# Patient Record
Sex: Male | Born: 1935 | Race: Black or African American | Hispanic: No | Marital: Married | Smoking: Former smoker
Health system: Southern US, Community
[De-identification: ages and names within clinical notes are randomized; demographics above are authoritative.]

## PROBLEM LIST (undated history)

## (undated) DIAGNOSIS — E119 Type 2 diabetes mellitus without complications: Secondary | ICD-10-CM

## (undated) DIAGNOSIS — N400 Enlarged prostate without lower urinary tract symptoms: Secondary | ICD-10-CM

## (undated) DIAGNOSIS — I10 Essential (primary) hypertension: Secondary | ICD-10-CM

## (undated) DIAGNOSIS — N390 Urinary tract infection, site not specified: Secondary | ICD-10-CM

## (undated) DIAGNOSIS — N3281 Overactive bladder: Secondary | ICD-10-CM

## (undated) HISTORY — DX: Type 2 diabetes mellitus without complications: E11.9

## (undated) HISTORY — DX: Essential (primary) hypertension: I10

## (undated) HISTORY — PX: NO PAST SURGERIES: SHX2092

## (undated) HISTORY — PX: OTHER SURGICAL HISTORY: SHX169

---

## 2012-11-11 DIAGNOSIS — N400 Enlarged prostate without lower urinary tract symptoms: Secondary | ICD-10-CM | POA: Insufficient documentation

## 2014-05-10 DIAGNOSIS — I10 Essential (primary) hypertension: Secondary | ICD-10-CM | POA: Insufficient documentation

## 2014-09-10 DIAGNOSIS — R7303 Prediabetes: Secondary | ICD-10-CM | POA: Insufficient documentation

## 2017-01-07 DIAGNOSIS — Z66 Do not resuscitate: Secondary | ICD-10-CM | POA: Insufficient documentation

## 2018-09-07 ENCOUNTER — Ambulatory Visit (INDEPENDENT_AMBULATORY_CARE_PROVIDER_SITE_OTHER): Payer: Medicare HMO | Admitting: Urology

## 2018-09-07 ENCOUNTER — Encounter: Payer: Self-pay | Admitting: Urology

## 2018-09-07 VITALS — BP 150/90 | HR 89 | Ht 68.0 in | Wt 182.7 lb

## 2018-09-07 DIAGNOSIS — N401 Enlarged prostate with lower urinary tract symptoms: Secondary | ICD-10-CM | POA: Diagnosis not present

## 2018-09-07 DIAGNOSIS — R351 Nocturia: Secondary | ICD-10-CM

## 2018-09-07 DIAGNOSIS — N3281 Overactive bladder: Secondary | ICD-10-CM | POA: Diagnosis not present

## 2018-09-07 DIAGNOSIS — N138 Other obstructive and reflux uropathy: Secondary | ICD-10-CM

## 2018-09-07 DIAGNOSIS — N3941 Urge incontinence: Secondary | ICD-10-CM

## 2018-09-07 LAB — MICROSCOPIC EXAMINATION
BACTERIA UA: NONE SEEN
Epithelial Cells (non renal): NONE SEEN /hpf (ref 0–10)

## 2018-09-07 LAB — URINALYSIS, COMPLETE
Bilirubin, UA: NEGATIVE
GLUCOSE, UA: NEGATIVE
KETONES UA: NEGATIVE
NITRITE UA: NEGATIVE
Protein, UA: NEGATIVE
RBC, UA: NEGATIVE
SPEC GRAV UA: 1.015 (ref 1.005–1.030)
Urobilinogen, Ur: 0.2 mg/dL (ref 0.2–1.0)
pH, UA: 7 (ref 5.0–7.5)

## 2018-09-07 LAB — BLADDER SCAN AMB NON-IMAGING: Scan Result: 219

## 2018-09-07 MED ORDER — FINASTERIDE 5 MG PO TABS: 5.0000 mg | ORAL_TABLET | Freq: Every day | ORAL | 3 refills | Status: DC

## 2018-09-07 NOTE — Progress Notes (Addendum)
09/07/2018 9:23 AM   James Oconnor 17-Jan-1930 [id number]  Referring provider: Dion Body, MD [name] [name], [name] [address]  Chief Complaint  Patient presents with  . Over Active Bladder    HPI: Patient is a 84 year old African American male with HTN and DM who is referred by Dr. Dion Oconnor for OAB.  He is experiencing urinary frequency, urgency, dysuria, nocturia x 2-3, intermittency, hesitancy, straining to urinate, weak urinary stream and urge incontinence.   Patient denies any gross hematuria, dysuria or suprapubic/flank pain.  Patient denies any fevers, chills, nausea or vomiting.  His UA is negative.  His PVR is 219 mL.    He has been on Cialis 5 mg daily for three years.  His urinary symptoms have been worsening since April.    He had been on finasteride since 2011 and then it was discontinued.  He thinks it was because the physician felt the Flomax would address his symptoms better.   In 2012, he was seen by [facility] in [place] for BPH and LU TS.  He was recommended two medication and neither was effective.  He does recall one of the medication was Myrbetriq and it caused constipation.   He admits to constipation and/or diarrhea.  He is drinking a lot of water daily.   He is drinking cranberry juice.  No coffee, sodas, teas or alcohol.      PMH: Past Medical History:  Diagnosis Date  . Diabetes mellitus without complication ([place])   . Hypertension     Surgical History: None  Home Medications:  Allergies as of 09/07/2018   Not on File     Medication List        Accurate as of 09/07/18  9:23 AM. Always use your most recent med list.          lisinopril 10 MG tablet Commonly known as:  PRINIVIL,ZESTRIL Take 10 mg by mouth daily.   tadalafil 5 MG tablet Commonly known as:  CIALIS Take 5 mg by mouth daily as needed for erectile dysfunction (take 2.5 daily).       Allergies: Not on  File  Family History: No family history on file.  Social History:  reports that he has quit smoking. His smoking use included cigarettes. He quit after 60.00 years of use. He has never used smokeless tobacco. His alcohol and drug histories are not on file.  ROS: UROLOGY Frequent Urination?: No Hard to postpone urination?: No Burning/pain with urination?: No Get up at night to urinate?: No Leakage of urine?: No Urine stream starts and stops?: No Trouble starting stream?: No Do you have to strain to urinate?: No Blood in urine?: No Urinary tract infection?: No Sexually transmitted disease?: No Injury to kidneys or bladder?: No Painful intercourse?: No Weak stream?: Yes Erection problems?: No Penile pain?: No  Gastrointestinal Nausea?: No Vomiting?: No Indigestion/heartburn?: No Diarrhea?: No Constipation?: No  Constitutional Fever: No Night sweats?: No Weight loss?: No Fatigue?: No  Skin Skin rash/lesions?: No Itching?: No  Eyes Blurred vision?: No Double vision?: No  Ears/Nose/Throat Sore throat?: No Sinus problems?: No  Hematologic/Lymphatic Swollen glands?: No Easy bruising?: No  Cardiovascular Leg swelling?: No Chest pain?: No  Respiratory Cough?: No Shortness of breath?: No  Endocrine Excessive thirst?: No  Musculoskeletal Back pain?: No Joint pain?: Yes  Neurological Headaches?: No Dizziness?: No  Psychologic Depression?: No Anxiety?: No  Physical Exam: BP (!) 150/90 (BP Location: Left Arm, Patient  Position: Sitting, Cuff Size: Normal)   Pulse 89   Ht 5\' 8"  (1.727 m)   Wt 182 lb 11.2 oz (82.9 kg)   BMI 27.78 kg/m   Constitutional:  Well nourished. Alert and oriented, No acute distress. HEENT: [place] AT, moist mucus membranes.  Trachea midline, no masses. Cardiovascular: No clubbing, cyanosis, or edema. Respiratory: Normal respiratory effort, no increased work of breathing. GI: Abdomen is soft, non tender, non distended, no  abdominal masses. Liver and spleen not palpable.  No hernias appreciated.  Stool sample for occult testing is not indicated.   GU: No CVA tenderness.  No bladder fullness or masses.  Patient with circumcised phallus.  Urethral meatus is patent.  No penile discharge. No penile lesions or rashes. Scrotum without lesions, cysts, rashes and/or edema.  Testicles are located scrotally bilaterally. No masses are appreciated in the testicles. Left and right epididymis are normal. Rectal: Patient with  normal sphincter tone. Anus and perineum without scarring or rashes. No rectal masses are appreciated. Prostate is approximately 70 + grams, could only palpate the apex and midportion of gland, no nodules are appreciated.  Skin: No rashes, bruises or suspicious lesions. Lymph: No cervical or inguinal adenopathy. Neurologic: Grossly intact, no focal deficits, moving all 4 extremities. Psychiatric: Normal mood and affect.  Laboratory Data: PSA Trend  3.30 in November 2016  3.24 and February 2018   No results found for: WBC, HGB, HCT, MCV, PLT  No results found for: CREATININE  No results found for: PSA  No results found for: TESTOSTERONE  No results found for: HGBA1C  No results found for: TSH  No results found for: CHOL, HDL, CHOLHDL, VLDL, LDLCALC  No results found for: AST No results found for: ALT No components found for: ALKALINEPHOPHATASE No components found for: BILIRUBINTOTAL  No results found for: ESTRADIOL  Urinalysis No results found for: COLORURINE, APPEARANCEUR, LABSPEC, PHURINE, GLUCOSEU, HGBUR, BILIRUBINUR, KETONESUR, PROTEINUR, UROBILINOGEN, NITRITE, LEUKOCYTESUR  I have reviewed the labs.   Pertinent Imaging: Results for James Oconnor (MRN [id number]) as of 09/07/2018 09:18  Ref. Range 09/07/2018 08:32  Scan Result Unknown [id number]     Assessment & Plan:    1. BPH with LUTS Continue conservative management, avoiding bladder irritants and timed voiding's Most bothersome  symptoms is/are urge incontinence -explained to the patient that this is due to bladder emptying likely due to obstruction caused by his large prostate.  I discussed with him that the tadalafil was not addressing the size of the prostate and that one option would be is to restart the finasteride at this time.  I did explain to the patient that the effects of the finasteride may not be seen for 6 months and that he may want to consider undergoing an evaluation for a possible bladder outlet procedure as that would give him more immediate results.  He has decided against any surgical procedure and would like to try the finasteride at this time. I also explained that we typically do not screen for prostate cancer in gentleman of his age him about his prostate enlargement may be due to a prostate cancer and if so the finasteride may not be effective in treating his lower urinary tract symptoms so for that reason we will obtain a PSA at this time. I have sent a prescription for the finasteride 5 mg daily to his preferred pharmacy and he will continue the tadalafil 5 mg daily as well He will return in 3 months, midway through therapy, for IPSS score,  PSA and PVR  2.  Urge incontinence See above  3. Nocturia Likely due to incomplete bladder emptying Will reassess at 3 and 6 months    Return in about 3 months (around 12/08/2018) for IPSS, PVR, PSA and exam.  These notes generated with voice recognition software. I apologize for typographical errors.  Zara Council, PA-C  [facility] [address]  [place], [place] [address] [phone]

## 2018-09-08 ENCOUNTER — Other Ambulatory Visit: Payer: Self-pay | Admitting: Urology

## 2018-09-08 DIAGNOSIS — R972 Elevated prostate specific antigen [PSA]: Secondary | ICD-10-CM

## 2018-09-08 LAB — PSA: Prostate Specific Ag, Serum: 7.2 ng/mL — ABNORMAL HIGH (ref 0.0–4.0)

## 2018-09-08 NOTE — Progress Notes (Signed)
MRI orders in the chart

## 2018-10-21 ENCOUNTER — Telehealth: Payer: Self-pay | Admitting: Urology

## 2018-10-21 NOTE — Telephone Encounter (Signed)
Patient was not home but I gave his wife the number and asked her to have him call [facility] to schedule    James Oconnor

## 2018-10-21 NOTE — Telephone Encounter (Signed)
Would you check on the status of Mr. James Oconnor of the prostate?

## 2018-11-08 ENCOUNTER — Ambulatory Visit
Admission: RE | Admit: 2018-11-08 | Discharge: 2018-11-08 | Disposition: A | Discharge status: Home / Self Care | Payer: Medicare HMO | Source: Ambulatory Visit | Attending: Urology | Admitting: Urology

## 2018-11-08 DIAGNOSIS — R972 Elevated prostate specific antigen [PSA]: Secondary | ICD-10-CM

## 2018-11-08 MED ORDER — GADOBENATE DIMEGLUMINE 529 MG/ML IV SOLN
18.0000 mL | Freq: Once | INTRAVENOUS | Status: AC | PRN
  Administered 2018-11-08: 18 mL via INTRAVENOUS

## 2018-12-06 ENCOUNTER — Other Ambulatory Visit: Payer: Self-pay

## 2018-12-06 DIAGNOSIS — R972 Elevated prostate specific antigen [PSA]: Secondary | ICD-10-CM

## 2018-12-07 ENCOUNTER — Other Ambulatory Visit: Payer: Medicare HMO

## 2018-12-07 DIAGNOSIS — R972 Elevated prostate specific antigen [PSA]: Secondary | ICD-10-CM

## 2018-12-07 NOTE — Progress Notes (Signed)
12/09/2018 10:07 AM   James Oconnor 1930/11/03 [id number]  Referring provider: Dion Body, MD [facility] [place], [place] [address]  Chief Complaint  Patient presents with  . Follow-up   HPI: James Oconnor is a 85 y.o. male African American with HTN and DM with a history of elevated PSA and PI-RADS 4 lesion on MRI of prostate who presents today for 3 month follow up.    Background History He first presented on 09/07/2018 reporting that he had been experiencing urinary frequency, urgency, dysuria, nocturia x 2-3, intermittency, hesitancy, straining to urinate, weak urinary stream and urge incontinence.   Patient denied at that time any gross hematuria, dysuria or suprapubic/flank pain.  Patient denied at that time any fevers, chills, nausea or vomiting.  His UA was negative and his PVR was 219 mL.    He has been on Cialis 5 mg daily for three years.  His urinary symptoms have been worsening since April.    He had been on finasteride since 2011 and then it was discontinued for a period; he thought it was because the physician felt the Flomax would address his symptoms better.   In 2012, he was seen by [facility] in [place] for BPH and LU TS.  He was recommended two medications and neither was effective.  He does recall one of the medication was Myrbetriq and it caused constipation.   He admitted to constipation and/or diarrhea.  He was drinking a lot of water daily and cranberry juice.  No coffee, sodas, teas or alcohol.  Started back on finasteride 5 mg daily on 09/07/2018.    BPH WITH LUTS  (prostate and/or bladder) IPSS score: 17/2 PVR: 209 mL   Previous score: No previous score   Previous PVR: 219 mL  Major complaint(s):  Nocturia 2 x. Denies any dysuria, hematuria or suprapubic pain.   Currently taking: finasteride 5 mg daily.  He reports a reduction in urgency.  He has temporarily discontinued the Cialis but is considering resuming  it.  He does not have a family history of PCa.  IPSS    Row Name 12/09/18 0800         International Prostate Symptom Score   How often have you had the sensation of not emptying your bladder?  Less than half the time     How often have you had to urinate less than every two hours?  About half the time     How often have you found you stopped and started again several times when you urinated?  Less than half the time     How often have you found it difficult to postpone urination?  About half the time     How often have you had a weak urinary stream?  Less than half the time     How often have you had to strain to start urination?  Less than half the time     How many times did you typically get up at night to urinate?  3 Times     Total IPSS Score  17       Quality of Life due to urinary symptoms   If you were to spend the rest of your life with your urinary condition just the way it is now how would you feel about that?  Mostly Satisfied       Score:  1-7 Mild 8-19 Moderate 20-35 Severe   Elevated PSA PSA was found to  be 7.2 on 09/07/2018.  MRI prostate on 11/08/2018 was revealed A PI-RADS category 4 lesion of the right posterolateral peripheral zone at the apex is the only lesion of concern identified. Appropriate targeting data sent to [place]. Benign prostatic hypertrophy with marked prostatomegaly, prostate volume 155.77 cubic cm.  Trabeculation of the bladder wall with small bladder diverticula raising the possibility of some degree of chronic outlet obstruction.  Patient deferred pursuing a biopsy at this time.  Repeated PSA 2.7 on 12/07/2018.    PMH: Past Medical History:  Diagnosis Date  . Diabetes mellitus without complication ([place])   . Hypertension     Surgical History: None  Home Medications:  Allergies as of 12/09/2018   No Known Allergies     Medication List       Accurate as of December 09, 2018 10:07 AM. Always use your most recent med list.         finasteride 5 MG tablet Commonly known as:  PROSCAR Take 1 tablet (5 mg total) by mouth daily.   lisinopril 10 MG tablet Commonly known as:  PRINIVIL,ZESTRIL Take 10 mg by mouth daily.   tadalafil 5 MG tablet Commonly known as:  CIALIS Take 5 mg by mouth daily as needed for erectile dysfunction (take 2.5 daily).       Allergies: No Known Allergies  Family History: History reviewed. No pertinent family history.  Social History:  reports that he has quit smoking. His smoking use included cigarettes. He quit after 60.00 years of use. He has never used smokeless tobacco. No history on file for alcohol and drug.  ROS: UROLOGY Frequent Urination?: Yes Hard to postpone urination?: Yes Burning/pain with urination?: No Get up at night to urinate?: Yes Leakage of urine?: Yes Urine stream starts and stops?: No Trouble starting stream?: No Do you have to strain to urinate?: No Blood in urine?: No Urinary tract infection?: No Sexually transmitted disease?: No Injury to kidneys or bladder?: No Painful intercourse?: No Weak stream?: No Erection problems?: Yes Penile pain?: No  Gastrointestinal Nausea?: No Vomiting?: No Indigestion/heartburn?: No Diarrhea?: No Constipation?: No  Constitutional Fever: No Night sweats?: No Weight loss?: No Fatigue?: No  Skin Skin rash/lesions?: No Itching?: No  Eyes Blurred vision?: Yes Double vision?: No  Ears/Nose/Throat Sore throat?: No Sinus problems?: No  Hematologic/Lymphatic Swollen glands?: No Easy bruising?: No  Cardiovascular Leg swelling?: No Chest pain?: No  Respiratory Cough?: No Shortness of breath?: No  Endocrine Excessive thirst?: No  Musculoskeletal Back pain?: No Joint pain?: No  Neurological Headaches?: No Dizziness?: No  Psychologic Depression?: No Anxiety?: No  Physical Exam: BP (!) 144/90   Pulse 80   Ht 5\' 8"  (1.727 m)   Wt 190 lb (86.2 kg)   BMI 28.89 kg/m   Constitutional:   Well nourished. Alert and oriented, No acute distress. Cardiovascular: No clubbing, cyanosis, or edema. Respiratory: Normal respiratory effort, no increased work of breathing. Skin: No rashes, bruises or suspicious lesions. Neurologic: Grossly intact, no focal deficits, moving all 4 extremities. Psychiatric: Normal mood and affect.  Laboratory Data:  PSA Trend  3.30 in November 2016  3.24 in February 2018  7.2 in October 2019  2.7 in January 2020  I have reviewed the labs.  Pertinent Imaging:  CLINICAL DATA:  Elevated PSA levels, PSA level 7.2 on 09/07/2018. Urinary frequency with urgency and dysuria.  EXAM: MR PROSTATE WITHOUT AND WITH CONTRAST  TECHNIQUE: Multiplanar multisequence MRI images were obtained of the pelvis centered about the prostate.  Pre and post contrast images were obtained.  CONTRAST:  48mL MULTIHANCE GADOBENATE DIMEGLUMINE 529 MG/ML IV SOLN  Creatinine was obtained on site at [place] at [address]  Results: Creatinine 1.3 mg/dL.  COMPARISON:  None.  FINDINGS: Prostate: A PI-RADS category 4 lesion of the right posterolateral peripheral zone at the apex is designated region of interest # 1 and measures 0.60 cubic cm (1.6 by 0.6 by 0.9 cm). This lesion has low T2 and ADC map characteristics and does appear to have early enhancement on dynamic images.  Notable prominence of the central gland in a multinodular pattern characteristic of benign prostatic hypertrophy.  Volume: 3D volumetric assessment: Prostate volume 155.77 cubic cm (6.7 by 6.0 by 8.3 cm).  Transcapsular spread:  Absent  Seminal vesicle involvement: Absent  Neurovascular bundle involvement: Absent  Pelvic adenopathy: Absent  Bone metastasis: Absent  Other findings: Trabeculation of the bladder margin with small bladder diverticula suggesting some degree of chronic outlet obstruction. 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  IMPRESSION: 1. A PI-RADS category 4 lesion of the right posterolateral peripheral zone at the apex is the only lesion of concern identified. Appropriate targeting data sent to [place]. 2. Benign prostatic hypertrophy with marked prostatomegaly, prostate volume 155.77 cubic cm. 3. Trabeculation of the bladder wall with small bladder diverticula raising the possibility of some degree of chronic outlet obstruction.   Electronically Signed   By: Van Clines M.D.   On: 11/09/2018 09:05  Results for HALIM, SURRETTE (MRN [id number]) as of 09/07/2018 09:18  Ref. Range 09/07/2018 08:32 12/09/2018 09:00  Scan Result Unknown [id number]    Assessment & Plan:    1. History of elevated PSA - PSA 7.2 in 08/2018, repeated PSA 2.7 in 11/2018 - MRI of prostate notes PI-RADS 4 lesion, but patient has deferred biopsy due to age and co-morbidities  - RTC in 6 months for PSA  2. BPH with LUTS IPSS score is 17/2  Continue conservative management, avoiding bladder irritants and timed voiding's Continue finasteride 5 mg daily; refills given May restart Cialis  RTC in 6 months for IPSS, PSA, PVR and exam   3.  Urge incontinence Improved  4. Nocturia - Likely due to incomplete bladder emptying   Return in about 6 months (around 06/09/2019) for IPSS, PVR, PSA and exam.  I, Adele Schilder, am acting as a Education administrator for [employer], PA-C.   I have reviewed the above documentation for accuracy and completeness, and I agree with the above.    Zara Council, PA-C  [facility] [address]  [place], [place] [address] [phone]

## 2018-12-08 LAB — PSA: Prostate Specific Ag, Serum: 2.7 ng/mL (ref 0.0–4.0)

## 2018-12-09 ENCOUNTER — Ambulatory Visit (INDEPENDENT_AMBULATORY_CARE_PROVIDER_SITE_OTHER): Payer: Medicare HMO | Admitting: Urology

## 2018-12-09 ENCOUNTER — Encounter: Payer: Self-pay | Admitting: Urology

## 2018-12-09 VITALS — BP 144/90 | HR 80 | Ht 68.0 in | Wt 190.0 lb

## 2018-12-09 DIAGNOSIS — N401 Enlarged prostate with lower urinary tract symptoms: Secondary | ICD-10-CM

## 2018-12-09 DIAGNOSIS — Z87898 Personal history of other specified conditions: Secondary | ICD-10-CM | POA: Diagnosis not present

## 2018-12-09 DIAGNOSIS — N3941 Urge incontinence: Secondary | ICD-10-CM

## 2018-12-09 DIAGNOSIS — R972 Elevated prostate specific antigen [PSA]: Secondary | ICD-10-CM

## 2018-12-09 DIAGNOSIS — R351 Nocturia: Secondary | ICD-10-CM | POA: Diagnosis not present

## 2018-12-09 DIAGNOSIS — N138 Other obstructive and reflux uropathy: Secondary | ICD-10-CM

## 2018-12-09 LAB — BLADDER SCAN AMB NON-IMAGING: Scan Result: 209

## 2018-12-09 MED ORDER — FINASTERIDE 5 MG PO TABS: 5.0000 mg | ORAL_TABLET | Freq: Every day | ORAL | 3 refills | Status: DC

## 2019-01-11 DIAGNOSIS — Z862 Personal history of diseases of the blood and blood-forming organs and certain disorders involving the immune mechanism: Secondary | ICD-10-CM | POA: Insufficient documentation

## 2019-05-03 ENCOUNTER — Other Ambulatory Visit: Payer: Self-pay | Admitting: Physician Assistant

## 2019-05-03 ENCOUNTER — Ambulatory Visit
Admission: RE | Admit: 2019-05-03 | Discharge: 2019-05-03 | Disposition: A | Discharge status: Home / Self Care | Payer: Medicare HMO | Source: Ambulatory Visit | Attending: Physician Assistant | Admitting: Physician Assistant

## 2019-05-03 ENCOUNTER — Other Ambulatory Visit: Payer: Self-pay

## 2019-05-03 DIAGNOSIS — M79604 Pain in right leg: Secondary | ICD-10-CM | POA: Diagnosis present

## 2019-05-31 DIAGNOSIS — N528 Other male erectile dysfunction: Secondary | ICD-10-CM | POA: Insufficient documentation

## 2019-06-06 ENCOUNTER — Other Ambulatory Visit: Payer: Self-pay

## 2019-06-06 DIAGNOSIS — R972 Elevated prostate specific antigen [PSA]: Secondary | ICD-10-CM

## 2019-06-07 ENCOUNTER — Other Ambulatory Visit: Payer: Medicare HMO

## 2019-06-07 ENCOUNTER — Other Ambulatory Visit: Payer: Self-pay

## 2019-06-07 DIAGNOSIS — R972 Elevated prostate specific antigen [PSA]: Secondary | ICD-10-CM

## 2019-06-08 LAB — PSA: Prostate Specific Ag, Serum: 2.8 ng/mL (ref 0.0–4.0)

## 2019-06-08 NOTE — Progress Notes (Signed)
12/09/2018 9:11 AM   James Oconnor 1930-04-30 [id number]  Referring provider: Dion Body, MD [facility] [place],  [place] [address]  Chief Complaint  Patient presents with  . Elevated PSA  . Benign Prostatic Hypertrophy   HPI: James Oconnor is a 85 y.o. male who is DNR with HTN and DM with a history of elevated PSA and PI-RADS 4 lesion on MRI of prostate who presents today for 3 month follow up.    Background History He first presented on 09/07/2018 reporting that he had been experiencing urinary frequency, urgency, dysuria, nocturia x 2-3, intermittency, hesitancy, straining to urinate, weak urinary stream and urge incontinence.   Patient denied at that time any gross hematuria, dysuria or suprapubic/flank pain.  Patient denied at that time any fevers, chills, nausea or vomiting.  His UA was negative and his PVR was 219 mL.    He has been on Cialis 5 mg daily for three years.  His urinary symptoms have been worsening since April.    He had been on finasteride since 2011 and then it was discontinued for a period; he thought it was because the physician felt the Flomax would address his symptoms better.   In 2012, he was seen by [facility] in [place] for BPH and LU TS.  He was recommended two medications and neither was effective.  He does recall one of the medication was Myrbetriq and it caused constipation.   He admitted to constipation and/or diarrhea.  He was drinking a lot of water daily and cranberry juice.  No coffee, sodas, teas or alcohol.  Started back on finasteride 5 mg daily on 09/07/2018.     BPH WITH LUTS  (prostate and/or bladder) IPSS score: 32/6    PVR: 202 mL   Previous score: 17/2  Previous PVR: 209 mL  Major complaint(s):  Frequency, urgency, nocturia, incontinence, hesitancy, straining and weak stream  x. Denies any dysuria, hematuria or suprapubic pain.   Currently taking: finasteride 5 mg daily.  He stopped the  finasteride in June due to side effects that he read about on the Internet.    He reports a reduction in urgency.  He will restart the Cialis 5 mg daily.    He does not have a family history of PCa.  IPSS    Row Name 06/09/19 0800         International Prostate Symptom Score   How often have you had the sensation of not emptying your bladder?  Almost always     How often have you had to urinate less than every two hours?  More than half the time     How often have you found you stopped and started again several times when you urinated?  More than half the time     How often have you found it difficult to postpone urination?  Almost always     How often have you had a weak urinary stream?  Almost always     How often have you had to strain to start urination?  More than half the time     How many times did you typically get up at night to urinate?  5 Times     Total IPSS Score  32       Quality of Life due to urinary symptoms   If you were to spend the rest of your life with your urinary condition just the way it is now how would  you feel about that?  Terrible       Score:  1-7 Mild 8-19 Moderate 20-35 Severe   Elevated PSA PSA was found to be 7.2 on 09/07/2018.  MRI prostate on 11/08/2018 was revealed A PI-RADS category 4 lesion of the right posterolateral peripheral zone at the apex is the only lesion of concern identified. Appropriate targeting data sent to [place].  Benign prostatic hypertrophy with marked prostatomegaly, prostate volume 155.77 cubic cm.  Trabeculation of the bladder wall with small bladder diverticula raising the possibility of some degree of chronic outlet obstruction.  Patient deferred pursuing a biopsy at this time.  Repeated PSA 2.8 on 05/2019.    PMH: Past Medical History:  Diagnosis Date  . Diabetes mellitus without complication ([place])   . Hypertension     Surgical History: None  Home Medications:  Allergies as of 06/09/2019   No Known Allergies      Medication List       Accurate as of June 09, 2019  9:11 AM. If you have any questions, ask your nurse or doctor.        STOP taking these medications   meloxicam 7.5 MG tablet Commonly known as: MOBIC Stopped by: Zara Council, PA-C     TAKE these medications   finasteride 5 MG tablet Commonly known as: Proscar Take 1 tablet (5 mg total) by mouth daily.   lisinopril 10 MG tablet Commonly known as: ZESTRIL Take 10 mg by mouth daily.   tadalafil 5 MG tablet Commonly known as: CIALIS Take 5 mg by mouth daily as needed for erectile dysfunction (take 2.5 daily).       Allergies: No Known Allergies  Family History: History reviewed. No pertinent family history.  Social History:  reports that he has quit smoking. His smoking use included cigarettes. He quit after 60.00 years of use. He has never used smokeless tobacco. No history on file for alcohol and drug.  ROS: UROLOGY Frequent Urination?: Yes Hard to postpone urination?: Yes Burning/pain with urination?: No Get up at night to urinate?: Yes Leakage of urine?: Yes Urine stream starts and stops?: Yes Trouble starting stream?: Yes Do you have to strain to urinate?: Yes Blood in urine?: No Urinary tract infection?: No Sexually transmitted disease?: No Injury to kidneys or bladder?: No Painful intercourse?: No Weak stream?: Yes Erection problems?: Yes Penile pain?: No  Gastrointestinal Nausea?: No Vomiting?: No Indigestion/heartburn?: No Diarrhea?: No Constipation?: No  Constitutional Fever: No Night sweats?: No Weight loss?: No Fatigue?: No  Skin Skin rash/lesions?: No Itching?: No  Eyes Blurred vision?: Yes Double vision?: No  Ears/Nose/Throat Sore throat?: No Sinus problems?: No  Hematologic/Lymphatic Swollen glands?: No Easy bruising?: No  Cardiovascular Leg swelling?: No Chest pain?: No  Respiratory Cough?: Yes Shortness of breath?: No  Endocrine Excessive thirst?: No   Musculoskeletal Back pain?: No Joint pain?: Yes  Neurological Headaches?: No Dizziness?: No  Psychologic Depression?: No Anxiety?: No  Physical Exam: There were no vitals taken for this visit.  Constitutional:  Well nourished. Alert and oriented, No acute distress. HEENT: [place] AT, moist mucus membranes.  Trachea midline, no masses. Cardiovascular: No clubbing, cyanosis, or edema. Respiratory: Normal respiratory effort, no increased work of breathing. GI: Abdomen is soft, non tender, non distended, no abdominal masses. Liver and spleen not palpable.  No hernias appreciated.  Stool sample for occult testing is not indicated.   GU: No CVA tenderness.  No bladder fullness or masses.  Patient with circumcised phallus.  Urethral  meatus is patent.  No penile discharge. No penile lesions or rashes. Scrotum without lesions, cysts, rashes and/or edema.  Testicles are located scrotally bilaterally. No masses are appreciated in the testicles. Left and right epididymis are normal. Rectal: Patient with  normal sphincter tone. Anus and perineum without scarring or rashes. No rectal masses are appreciated. Prostate is approximately 60 grams, could only palpate the apex and midportion of the gland, no nodules are appreciated.  Skin: No rashes, bruises or suspicious lesions. Lymph: No inguinal adenopathy. Neurologic: Grossly intact, no focal deficits, moving all 4 extremities. Psychiatric: Normal mood and affect.  Laboratory Data:  PSA Trend  3.30 in November 2016  3.24 in February 2018  7.2 in October 2019  2.7 in January 2020  2.8 in July 2020 I have reviewed the labs.  Pertinent Imaging:  CLINICAL DATA:  Elevated PSA levels, PSA level 7.2 on 09/07/2018. Urinary frequency with urgency and dysuria.  EXAM: MR PROSTATE WITHOUT AND WITH CONTRAST  TECHNIQUE: Multiplanar multisequence MRI images were obtained of the pelvis centered about the prostate. Pre and post contrast images were  obtained.  CONTRAST:  64mL MULTIHANCE GADOBENATE DIMEGLUMINE 529 MG/ML IV SOLN  Creatinine was obtained on site at [facility] at [address]  Results: Creatinine 1.3 mg/dL.  COMPARISON:  None.  FINDINGS: Prostate: A PI-RADS category 4 lesion of the right posterolateral peripheral zone at the apex is designated region of interest # 1 and measures 0.60 cubic cm (1.6 by 0.6 by 0.9 cm). This lesion has low T2 and ADC map characteristics and does appear to have early enhancement on dynamic images.  Notable prominence of the central gland in a multinodular pattern characteristic of benign prostatic hypertrophy.  Volume: 3D volumetric assessment: Prostate volume 155.77 cubic cm (6.7 by 6.0 by 8.3 cm).  Transcapsular spread:  Absent  Seminal vesicle involvement: Absent  Neurovascular bundle involvement: Absent  Pelvic adenopathy: Absent  Bone metastasis: Absent  Other findings: Trabeculation of the bladder margin with small bladder diverticula suggesting some degree of chronic outlet obstruction. 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  IMPRESSION: 1. A PI-RADS category 4 lesion of the right posterolateral peripheral zone at the apex is the only lesion of concern identified. Appropriate targeting data sent to [place]. 2. Benign prostatic hypertrophy with marked prostatomegaly, prostate volume 155.77 cubic cm. 3. Trabeculation of the bladder wall with small bladder diverticula raising the possibility of some degree of chronic outlet obstruction.   Electronically Signed   By: Van Clines M.D.   On: 11/09/2018 09:05  Assessment & Plan:    1. History of elevated PSA PSA 7.2 in 08/2018, repeated PSA 2.8 in 05/2019 - has stopped finasteride  MRI of prostate notes PI-RADS 4 lesion, but patient has deferred biopsy due to age and co-morbidities  RTC in 4 months for PSA  2. BPH with LUTS IPSS score is 32/6 Continue conservative  management, avoiding bladder irritants and timed voiding's Finasteride discontinued by patient  Will restart Cialis  RTC in 6 months for IPSS, PSA, PVR and exam   3.  Urge incontinence Will start Cialis and see if there is improvement and refer to PT as he could not tolerate OAB medications   4. Nocturia Likely due to incomplete bladder emptying   Return for PSA only .  Zara Council, PA-C  [facility] [address]  [place], [place] [address] [phone]

## 2019-06-09 ENCOUNTER — Encounter: Payer: Self-pay | Admitting: Urology

## 2019-06-09 ENCOUNTER — Other Ambulatory Visit: Payer: Self-pay

## 2019-06-09 ENCOUNTER — Ambulatory Visit (INDEPENDENT_AMBULATORY_CARE_PROVIDER_SITE_OTHER): Payer: Medicare HMO | Admitting: Urology

## 2019-06-09 VITALS — BP 129/78 | HR 91 | Ht 68.0 in | Wt 183.6 lb

## 2019-06-09 DIAGNOSIS — N3941 Urge incontinence: Secondary | ICD-10-CM | POA: Diagnosis not present

## 2019-06-09 DIAGNOSIS — N138 Other obstructive and reflux uropathy: Secondary | ICD-10-CM | POA: Diagnosis not present

## 2019-06-09 DIAGNOSIS — Z87898 Personal history of other specified conditions: Secondary | ICD-10-CM

## 2019-06-09 DIAGNOSIS — N401 Enlarged prostate with lower urinary tract symptoms: Secondary | ICD-10-CM

## 2019-06-09 DIAGNOSIS — R351 Nocturia: Secondary | ICD-10-CM | POA: Diagnosis not present

## 2019-06-09 LAB — BLADDER SCAN AMB NON-IMAGING: Scan Result: 202

## 2019-06-15 ENCOUNTER — Other Ambulatory Visit: Payer: Self-pay

## 2019-06-15 ENCOUNTER — Encounter: Payer: Self-pay | Admitting: Physical Therapy

## 2019-06-15 ENCOUNTER — Ambulatory Visit: Payer: Medicare HMO | Attending: Urology | Admitting: Physical Therapy

## 2019-06-15 DIAGNOSIS — M533 Sacrococcygeal disorders, not elsewhere classified: Secondary | ICD-10-CM | POA: Diagnosis present

## 2019-06-15 DIAGNOSIS — R278 Other lack of coordination: Secondary | ICD-10-CM

## 2019-06-15 DIAGNOSIS — M6281 Muscle weakness (generalized): Secondary | ICD-10-CM | POA: Diagnosis present

## 2019-06-15 NOTE — Patient Instructions (Signed)
   Standing:  10 reps on both sides x 3 x day     3 point tap   Feet are hip width Tap forward, center under hip not feet next to each other  Tap middle\, center  Tap back     ___   Stretches: 2  x day     Frog stretch: laying on belly with pillow under hips, knees bent, inhale do nothing, exhale let ankles fall apart 30 reps      childs poses rocking  Toes tucked, shoulders down and back, on forearms , hands shoulder width apart 10 reps

## 2019-06-16 NOTE — Therapy (Signed)
[place] MAIN [facility] SERVICES [address] [place], [place], [address] Phone: [phone]   Fax:  [phone]  Physical Therapy Evaluation  Patient Details  Name: James Oconnor MRN: [id number] Date of Birth: 07/03/1930 Referring Provider (PT): Zara Council    Encounter Date: 06/15/2019  PT End of Session - 06/15/19 1407    Visit Number  1    Number of Visits  10    Date for PT Re-Evaluation  08/24/19    PT Start Time  3825    PT Stop Time  1405    PT Time Calculation (min)  63 min       Past Medical History:  Diagnosis Date  . Diabetes mellitus without complication ([place])   . Hypertension     Past Surgical History:  Procedure Laterality Date  . none      There were no vitals filed for this visit.   Subjective Assessment - 06/15/19 1309    Subjective  Pt reports urge incontinence  started when he was being treated for BPH 30 years ago. Frequency has worsened, once every 2 hours. 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 On 1-2 occasions, pt has had leakage before making it to the bathroom. Pt is not emptying his bladder completely. Pt has a weaker stream. Urge occurs when playing golf. Leakage occurs with liftininclude gardening, golf, and eaattending an exercise program [facility] pre COVID: treadmill, walking, chest press, sit ups. Pt performed sit ups for 4 years, Since closure of gyms, pt has been stretching. Bowel movements occurs every day with occasional straining. Denied LBP.  Daily fluid intake: 64 fl oz of water, no tea, 16 fl oz juice, no coffee, no alcohol.  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  Pt would identify where identify where toielts are for "just in case" scenerios.    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 Recent orthopedic Hx:  Pt pulled his R hamstring in Jan 2020.  He also  had a fall in 2013 after falling off a ladder and landing R buttock. Xray showed sciatic nerve problem. Pt is undergoing Physical Therapy and will be d/c next Wednesday because he is 100% better. Denied surgeries , injuries except for the fall in Jan 2013    Patient Stated Goals  reduce the urgency to urinate         [facility] PT Assessment - 06/16/19 0948      Assessment   Medical Diagnosis  urge incontinence     Referring Provider (PT)  Zara Council       Precautions   Precautions  None      Restrictions   Weight Bearing Restrictions  No      Balance Screen   Has the patient fallen in the past 6 months  No      Coordination   Gross Motor Movements are Fluid and Coordinated  --   limited diaphragmatic excursion   Fine Motor Movements are Fluid and Coordinated  --   ab overuse w/ pelvic floor contraction cue     Posture/Postural Control   Posture Comments  perturbations with MMT hip flexion seated      PROM   Overall PROM Comments  R hip ext 0 deg, post Tx: ~10 deg       Strength  Overall Strength Comments  hip flex 4-/5 B, knee ext 3+/5 R, L 4-/5, knee flex B 5/5, B hip ext 3+/5        Palpation   Palpation comment  coccyx flexed, R SIJ hypomobile                 Objective measurements completed on examination: See above findings.    Pelvic Floor Special Questions - 06/16/19 0947    Diastasis Recti  neg     External Perineal Exam  no pelvic tightness       [facility] Adult PT Treatment/Exercise - 06/16/19 0948      Exercises   Exercises  --   cued for exercises, explained anatomy/ physiology     Manual Therapy   Manual therapy comments  superior glide at B sacrococcygeal ligament/ coccgeus, PA mob at base of sacrum, rotation mob to facilitate nutation, ext of coccyx                  PT Long Term Goals - 06/16/19 2094      PT LONG TERM GOAL #1   Title  "Pt will decrease  scores on NIH-CPSI from   44% to <22    % in order to restore pelvic  floor functions    Time  10    Period  Weeks    Status  New    Target Date  08/24/19      PT LONG TERM GOAL #2   Title  Pt will demo less flexed coccyx across 2 weeks in order to restore nutation of sacrum and longer stride length in gait    Time  4    Period  Weeks    Status  New    Target Date  07/14/19      PT LONG TERM GOAL #3   Title  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 in order to participate in functional tasks    Time  8    Period  Weeks    Status  New    Target Date  08/11/19      PT LONG TERM GOAL #4   Title  Pt will demo proper deep core coordination with proper diaphragmatic excursion in order to optimize postural stability and pelvic floor function    Time  2    Period  Weeks    Status  New    Target Date  06/30/19      PT LONG TERM GOAL #5   Title  Pt will demo increased strength at hip ext B from 3+/5 to >4+/5     in order to progress to fitness activities with less risk for injuries    Time  8    Period  Weeks    Status  New    Target Date  08/11/19             Plan - 06/16/19 0949    Clinical Impression Statement  Pt is a 85 yo male who reports urinary urgency, weaker urine stream, and incomplete emptying.These deficits impact his QOL. Pt presents with flexed coccyx and limited sacral nutation, dyscoordination and strength of pelvic floor mm, limited hip mobility,weak hip weakness, poor body mechanics which places strain on the abdominal/pelvic floor mm.  Pt performs many gravity-loaded tasks at work and will require PT in order to learn ways to gradually build their deep core mm to minimize Sx and risk for spinal injuries. Pt performs  many gravity-loaded tasks in fitness and gardening. Pt will benefit from proper coordination training and education on fitness and functional positions in order to yield greater outcomes as pt performed sit-ups/ crunches in the past. Advised pt to not perform sit-ups and crunches as these movement  patterns lead to more downward forces on her pelvic floor, negatively impacting  dysfunctions.  Contributing factors include Hx of fall onto tailbone after falling off a ladder in 2013, BPH, and Hx of sciatica/ R hamstring injury which improved with recent physical therapy HEP of stretches.  Regional interdependent approaches will yield greater benefits in pt's POC.  Pt was provided education on etiology of Sx with anatomy, physiology explanation with images along with the benefits of customized pelvic PT Tx based on pt's medical conditions and musculoskeletal deficits.  Explained the physiology of deep core mm coordination and roles of pelvic floor function in urination, defecation, sexual function, and postural control with deep core mm system.   Following Tx today which pt tolerated without complaints, pt demo'd less flexed coccyx and increased SIJ mobility.      Personal Factors and Comorbidities  Age;Fitness    Examination-Activity Limitations  Loss adjuster, chartered;Shop    Stability/Clinical Decision Making  Stable/Uncomplicated    Clinical Decision Making  Low    Rehab Potential  Good    PT Frequency  1x / week    PT Duration  --   10   PT Treatment/Interventions  Neuromuscular re-education;Moist Heat;Therapeutic activities;Functional mobility training;Therapeutic exercise;Patient/family education;Manual techniques;Manual lymph drainage;Taping;Balance training;Gait training    Consulted and Agree with Plan of Care  Patient       Patient will benefit from skilled therapeutic intervention in order to improve the following deficits and impairments:  Decreased coordination, Decreased range of motion, Decreased endurance, Decreased balance, Decreased mobility, Decreased strength, Postural dysfunction, Improper body mechanics, Decreased safety awareness, Increased muscle spasms, Hypomobility  Visit Diagnosis: 1. Muscle weakness (generalized)   2.  Sacrococcygeal disorders, not elsewhere classified   3. Other lack of coordination        Problem List Patient Active Problem List   Diagnosis Date Noted  . Other male erectile dysfunction 05/31/2019  . History of normocytic normochromic anemia 01/11/2019  . DNR (do not resuscitate) 01/07/2017  . Borderline diabetes mellitus 09/10/2014  . Essential hypertension 05/10/2014  . BPH (benign prostatic hyperplasia) 11/11/2012    Jerl Mina ,PT, DPT, E-RYT  06/16/2019, 9:53 AM  [place] MAIN [facility] SERVICES [address] [place], [place], [address] Phone: [phone]   Fax:  [phone]  Name: James Oconnor MRN: [id number] Date of Birth: [birth year]

## 2019-06-21 ENCOUNTER — Other Ambulatory Visit: Payer: Self-pay

## 2019-06-21 ENCOUNTER — Ambulatory Visit: Payer: Medicare HMO | Admitting: Physical Therapy

## 2019-06-21 DIAGNOSIS — M6281 Muscle weakness (generalized): Secondary | ICD-10-CM

## 2019-06-21 DIAGNOSIS — M533 Sacrococcygeal disorders, not elsewhere classified: Secondary | ICD-10-CM

## 2019-06-21 DIAGNOSIS — R278 Other lack of coordination: Secondary | ICD-10-CM

## 2019-06-21 NOTE — Therapy (Addendum)
[place] MAIN [facility] SERVICES [address] [place], [place], [address] Phone: [phone]   Fax:  [phone]  Physical Therapy Treatment  Patient Details  Name: James Oconnor MRN: [id number] Date of Birth: 28-May-1930 Referring Provider (PT): Zara Council    Encounter Date: 06/21/2019  PT End of Session - 06/21/19 1255    Visit Number  2    Number of Visits  10    Date for PT Re-Evaluation  08/24/19    PT Start Time  1200    PT Stop Time  8938    PT Time Calculation (min)  58 min       Past Medical History:  Diagnosis Date  . Diabetes mellitus without complication ([place])   . Hypertension     Past Surgical History:  Procedure Laterality Date  . none      There were no vitals filed for this visit.  Subjective Assessment - 06/21/19 1200    Subjective  Pt reported he was constipated last week. The frog exercises has helped. When he urinates, he has difficulty until he has a bowel movement. 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 Recent orthopedic Hx:  Pt pulled his R hamstring in Jan 2020.  He also had a fall in 2013 after falling off a ladder and landing R buttock. Xray showed sciatic nerve problem. Pt is undergoing Physical Therapy and will be d/c next Wednesday because he is 100% better. Denied surgeries , injuries except for the fall in Jan 2013    Patient Stated Goals  reduce the urgency to urinate         [facility] PT Assessment - 06/21/19 1249      Coordination   Gross Motor Movements are Fluid and Coordinated  --   difficulty with pelvic tilt propioception   Fine Motor Movements are Fluid and Coordinated  --   overactive adductors with pelvic floor cues     Flexibility   Soft Tissue Assessment /Muscle Length  --   adductor tightness                Pelvic Floor Special Questions - 06/21/19 1249    External Perineal Exam  pelvic floor  tightness at bulbospongiousus, ischiocavernosus B ( decreased post Tx)        [facility] Adult PT Treatment/Exercise - 06/21/19 1252      Neuro Re-ed    Neuro Re-ed Details   cued for form and technique with new exercises. pelvic floor coordination      Modalities   Modalities  --   5     Moist Heat Therapy   Number Minutes Moist Heat  5 Minutes    Moist Heat Location  --   perienum, clothing, pillow case ( during guided relaxation)     Manual Therapy   Manual therapy comments  STM/MWM at anterior pelvic floor mm, abdominal fascial pullingin quadriped/ trunk rotation/shoulder flexion to promote closure of DRA  , STM/ MWM PA mob at T-spine to promote rotatin/ diaphragmatic excursion                  PT Long Term Goals - 06/16/19 1017      PT LONG TERM GOAL #1   Title  "Pt will decrease  scores on NIH-CPSI from   44% to <22    % in order to restore pelvic floor functions  Time  10    Period  Weeks    Status  New    Target Date  08/24/19      PT LONG TERM GOAL #2   Title  Pt will demo less flexed coccyx across 2 weeks in order to restore nutation of sacrum and longer stride length in gait    Time  4    Period  Weeks    Status  New    Target Date  07/14/19      PT LONG TERM GOAL #3   Title  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 in order to participate in functional tasks    Time  8    Period  Weeks    Status  New    Target Date  08/11/19      PT LONG TERM GOAL #4   Title  Pt will demo proper deep core coordination with proper diaphragmatic excursion in order to optimize postural stability and pelvic floor function    Time  2    Period  Weeks    Status  New    Target Date  06/30/19      PT LONG TERM GOAL #5   Title  Pt will demo increased strength at hip ext B from 3+/5 to >4+/5     in order to progress to fitness activities with less risk for injuries    Time  8    Period  Weeks    Status  New    Target Date  08/11/19             Plan - 06/21/19 1256    Clinical Impression Statement  Pt no longer showed a  flexed coccyx with last sesion's manual Tx and exercises. Today focused on increasing diaphragm/ pelvic floor excursion,  decreasing diastasis recti,  and progressed to deep core coordination .  Pt demo'd proper coordination post Tx. Pt continues to benefit form skilled PT and anitcipate with today's improvements, pt will be able to lengthen his pelvic floor for initiating urination and decrease urinary frequency.     Personal Factors and Comorbidities  Age;Fitness    Examination-Activity Limitations  Loss adjuster, chartered;Shop    Stability/Clinical Decision Making  Stable/Uncomplicated    Rehab Potential  Good    PT Frequency  1x / week    PT Duration  --   10   PT Treatment/Interventions  Neuromuscular re-education;Moist Heat;Therapeutic activities;Functional mobility training;Therapeutic exercise;Patient/family education;Manual techniques;Manual lymph drainage;Taping;Balance training;Gait training    Consulted and Agree with Plan of Care  Patient       Patient will benefit from skilled therapeutic intervention in order to improve the following deficits and impairments:  Decreased coordination, Decreased range of motion, Decreased endurance, Decreased balance, Decreased mobility, Decreased strength, Postural dysfunction, Improper body mechanics, Decreased safety awareness, Increased muscle spasms, Hypomobility  Visit Diagnosis: 1. Sacrococcygeal disorders, not elsewhere classified   2. Muscle weakness (generalized)   3. Other lack of coordination        Problem List Patient Active Problem List   Diagnosis Date Noted  . Other male erectile dysfunction 05/31/2019  . History of normocytic normochromic anemia 01/11/2019  . DNR (do not resuscitate) 01/07/2017  . Borderline diabetes mellitus 09/10/2014  . Essential hypertension 05/10/2014  . BPH (benign  prostatic hyperplasia) 11/11/2012    Jerl Mina 06/21/2019, 12:57 PM  [place] MAIN [facility] SERVICES [address]  [place], [place], [address] Phone: [phone]   Fax:  [phone]  Name: Jayesh Marbach MRN: [id number] Date of Birth: Jul 26, 1930

## 2019-06-21 NOTE — Patient Instructions (Addendum)
Stretch for anterior triangle of pelvic floor   -Stretch for pelvic floor   "v heels slide away and then back toward buttocks and then rock knee to slight ,  slide heel along at 11 o clock away from buttocks   10 reps    ___  Stretch for midback for diaphragm mobility which impacts pelvic floor mobility  -open book (handout)    ___ Deep core coordination Deep core level 1 ( handout)      ___  Avoid straining pelvic floor, abdominal muscles , spine  Use log rolling technique instead of getting out of bed with your neck or the sit-up     Log rolling into and out of bed   Log rolling into and out of bed If getting out of bed on R side, Bent knees, scoot hips/ shoulder to L  Raise R arm completely overhead, rolling onto armpit  Then lower bent knees to bed to get into complete side lying position  Then drop legs off bed, and push up onto R elbow/forearm, and use L hand to push onto the bed

## 2019-06-29 ENCOUNTER — Ambulatory Visit: Payer: Medicare HMO | Admitting: Physical Therapy

## 2019-07-06 ENCOUNTER — Other Ambulatory Visit: Payer: Self-pay

## 2019-07-06 ENCOUNTER — Ambulatory Visit: Payer: Medicare HMO | Attending: Urology | Admitting: Physical Therapy

## 2019-07-06 DIAGNOSIS — M6281 Muscle weakness (generalized): Secondary | ICD-10-CM | POA: Diagnosis present

## 2019-07-06 DIAGNOSIS — R278 Other lack of coordination: Secondary | ICD-10-CM | POA: Insufficient documentation

## 2019-07-06 DIAGNOSIS — M533 Sacrococcygeal disorders, not elsewhere classified: Secondary | ICD-10-CM | POA: Diagnosis not present

## 2019-07-06 NOTE — Therapy (Signed)
[place] MAIN [facility] SERVICES [address] [place], [place], [address] Phone: [phone]   Fax:  [phone]  Physical Therapy Treatment  Patient Details  Name: James Oconnor MRN: [id number] Date of Birth: May 19, 1930 Referring Provider (PT): Zara Council    Encounter Date: 07/06/2019  PT End of Session - 07/06/19 1731    Visit Number  3    Number of Visits  10    Date for PT Re-Evaluation  08/24/19    PT Start Time  1300    PT Stop Time  1415    PT Time Calculation (min)  75 min    Activity Tolerance  Patient tolerated treatment well    Behavior During Therapy  [facility] for tasks assessed/performed       Past Medical History:  Diagnosis Date  . Diabetes mellitus without complication ([place])   . Hypertension     Past Surgical History:  Procedure Laterality Date  . none      There were no vitals filed for this visit.  Subjective Assessment - 07/06/19 1305    Subjective  Pt reports he is no longer  going to the bathroom once every 2 hours but 2 hours 50 min. He sees this as an improvement. Pt is having more difficulty with having daily bowel movements but now they are occurring every other day with straining.  No changes have been made to his diet. Pt states his R glut pain is also still hurting. Pt walked 10 x 60 ft which seemed to help.    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 Recent orthopedic Hx:  Pt pulled his R hamstring in Jan 2020.  He also had a fall in 2013 after falling off a ladder and landing R buttock. Xray showed sciatic nerve problem. Pt is undergoing Physical Therapy and will be d/c next Wednesday because he is 100% better. Denied surgeries , injuries except for the fall in Jan 2013    Patient Stated Goals  reduce the urgency to urinate         [facility] PT Assessment - 07/06/19 1722      Posture/Postural Control   Posture Comments  poor co-activation of lower kinetic chain with deep  core , overuse of gluts       PROM   Overall PROM Comments  hip IR in prone ( limited)        Palpation   Palpation comment  tightness at puborectalis, coccgeus , obt int B, piriformis, tenderness at ischial rami R, referred pain to hamstring with palpation at deep posterior R pelvic floor  coccyx less flexed       Ambulation/Gait   Gait Comments  shirt fold diagonal upper left to lower R, R hip sway, L thorax slightly to L                    [facility] Adult PT Treatment/Exercise - 07/06/19 1722      Neuro Re-ed    Neuro Re-ed Details   cued for less glut overuse, correct dyscoordination , less chest breathing       Moist Heat Therapy   Number Minutes Moist Heat  5 Minutes    Moist Heat Location  --   perineum during deep core instruction     Manual Therapy   Manual therapy comments  superior glide along sacrococcgyeal ligament, STM/ MWM puborectalis, coccgeus , obt int B,  PT Long Term Goals - 06/16/19 2297      PT LONG TERM GOAL #1   Title  "Pt will decrease  scores on NIH-CPSI from   44% to <22    % in order to restore pelvic floor functions    Time  10    Period  Weeks    Status  New    Target Date  08/24/19      PT LONG TERM GOAL #2   Title  Pt will demo less flexed coccyx across 2 weeks in order to restore nutation of sacrum and longer stride length in gait    Time  4    Period  Weeks    Status  New    Target Date  07/14/19      PT LONG TERM GOAL #3   Title  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 in order to participate in functional tasks    Time  8    Period  Weeks    Status  New    Target Date  08/11/19      PT LONG TERM GOAL #4   Title  Pt will demo proper deep core coordination with proper diaphragmatic excursion in order to optimize postural stability and pelvic floor function    Time  2    Period  Weeks    Status  New    Target Date  06/30/19      PT LONG TERM GOAL #5   Title  Pt will  demo increased strength at hip ext B from 3+/5 to >4+/5     in order to progress to fitness activities with less risk for injuries    Time  8    Period  Weeks    Status  New    Target Date  08/11/19            Plan - 07/06/19 1732    Clinical Impression Statement Pt 's urinary frequency is improving as his diastasis rectus has been corrected. Coccyx is no longer flexed but required manual Tx to release tight posterior pelvic floor mm.  Pt required excessive cues to perform proper deep core coordination/ strengthening with less glut overuse and less chest breathing.  Suspect pt's incorrect form with overuse of glut/ dyscoordination of pelvic floor is associated with his   recent issue with constipation. Pt demo'd improved diaphragmatic/ pelvic floor excursion post Tx.   Pt's R glut pain remains an issue. Plan to assess for leg length difference as R hip higher than L with slight thoracic shift over pelvis was  observed in gait.    Pt continues to benefit from skilled PT.     Personal Factors and Comorbidities  Age;Fitness    Examination-Activity Limitations  Loss adjuster, chartered;Shop    Stability/Clinical Decision Making  Stable/Uncomplicated    Rehab Potential  Good    PT Frequency  1x / week    PT Duration  --   10   PT Treatment/Interventions  Neuromuscular re-education;Moist Heat;Therapeutic activities;Functional mobility training;Therapeutic exercise;Patient/family education;Manual techniques;Manual lymph drainage;Taping;Balance training;Gait training    Consulted and Agree with Plan of Care  Patient       Patient will benefit from skilled therapeutic intervention in order to improve the following deficits and impairments:  Decreased coordination, Decreased range of motion, Decreased endurance, Decreased balance, Decreased mobility, Decreased strength, Postural dysfunction, Improper body mechanics, Decreased safety awareness, Increased  muscle spasms, Hypomobility  Visit Diagnosis: 1. Sacrococcygeal disorders, not elsewhere classified   2. Muscle weakness (generalized)   3. Other lack of coordination        Problem List Patient Active Problem List   Diagnosis Date Noted  . Other male erectile dysfunction 05/31/2019  . History of normocytic normochromic anemia 01/11/2019  . DNR (do not resuscitate) 01/07/2017  . Borderline diabetes mellitus 09/10/2014  . Essential hypertension 05/10/2014  . BPH (benign prostatic hyperplasia) 11/11/2012    Jerl Mina ,PT, DPT, E-RYT  07/06/2019, 5:36 PM  [place] MAIN [facility] SERVICES [address] [place], [place], [address] Phone: [phone]   Fax:  [phone]  Name: James Oconnor MRN: [id number] Date of Birth: 02/13/1930

## 2019-07-06 NOTE — Patient Instructions (Signed)
Twice a day   On belly: edge of mattress  knee bent like riding a horse, move knee towards armpit and out  15 reps   __  Refine your technique with deep core level 1 and 2    Pace your breathing 1- 2 pause , pause,  no rushing Not into the chest Feel more wide in the ribs   With Level 2 ( do not tighten buttocks, plant feet down, knees neutral  ____

## 2019-07-13 ENCOUNTER — Other Ambulatory Visit: Payer: Self-pay | Admitting: Family Medicine

## 2019-07-13 DIAGNOSIS — R1310 Dysphagia, unspecified: Secondary | ICD-10-CM

## 2019-07-20 ENCOUNTER — Ambulatory Visit: Payer: Medicare HMO | Admitting: Physical Therapy

## 2019-07-20 ENCOUNTER — Other Ambulatory Visit: Payer: Self-pay

## 2019-07-20 DIAGNOSIS — M533 Sacrococcygeal disorders, not elsewhere classified: Secondary | ICD-10-CM

## 2019-07-20 DIAGNOSIS — R278 Other lack of coordination: Secondary | ICD-10-CM

## 2019-07-20 DIAGNOSIS — M6281 Muscle weakness (generalized): Secondary | ICD-10-CM

## 2019-07-20 NOTE — Patient Instructions (Addendum)
Golf stretches:  Seated hamstring and rainbow  10 reps both sides   Seated with both feet hip width apart Rainbow with twist ( high 5)   10 reps    Seated figure-4  Cross R ankle over L thigh,  Left hand behind back, R hand on L thigh 5 breaths    ____  Walking with higher knees  Landing more on midfoot ballmounds And feet hip width apart   ___  Strengthening: Mini squat + side step 10 steps L/R    ___ R shoe lift

## 2019-07-20 NOTE — Therapy (Signed)
[place] MAIN [facility] SERVICES [address] [place], [place], [address] Phone: [phone]   Fax:  [phone]  Physical Therapy Treatment  Patient Details  Name: James Oconnor MRN: [id number] Date of Birth: 01-30-1930 Referring Provider (PT): Zara Council    Encounter Date: 07/20/2019  PT End of Session - 07/20/19 1645    Visit Number  4    Number of Visits  10    Date for PT Re-Evaluation  08/24/19    PT Start Time  1110    PT Stop Time  1210    PT Time Calculation (min)  60 min    Activity Tolerance  Patient tolerated treatment well    Behavior During Therapy  [facility] for tasks assessed/performed       Past Medical History:  Diagnosis Date  . Diabetes mellitus without complication ([place])   . Hypertension     Past Surgical History:  Procedure Laterality Date  . none      There were no vitals filed for this visit.  Subjective Assessment - 07/20/19 1116    Subjective  Pt 's remaining issue is with R glut pain that travels down above the knee. 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 Recent orthopedic Hx:  Pt pulled his R hamstring in Jan 2020.  He also had a fall in 2013 after falling off a ladder and landing R buttock. Xray showed sciatic nerve problem. Pt is undergoing Physical Therapy and will be d/c next Wednesday because he is 100% better. Denied surgeries , injuries except for the fall in Jan 2013    Patient Stated Goals  reduce the urgency to urinate         [facility] PT Assessment - 07/20/19 1643      Other:   Other/Comments  swinging golf club, excessive hip adduction, minimal pivotating on planted foot       Strength   Overall Strength Comments  R hip ext 3/5 ( post Tx: 4-/5) ,  L hip ext 3+/5 ( post Tx: 4-/10)       Palpation   Spinal mobility  increased paraspinal mm, medial mob L1-3       Ambulation/Gait   Gait Comments   before shoe lift in R shoe: R hip sway,  after shoe lift L : less hip sway    cued for longer stride, increased hip flexion/ stronger push                  [facility] Adult PT Treatment/Exercise - 07/20/19 1643      Neuro Re-ed    Neuro Re-ed Details   400 ft of gait training, to increase hip flexion, stronger push off        Exercises   Exercises  --   see pt instructions, selected to compliment golf     Manual Therapy   Manual therapy comments  PA mob along posterior borders of sacrum B , PA mob to increase hip extension B, STM along paraspinals, medial mob L1-3                   PT Long Term Goals - 06/16/19 6812      PT LONG TERM GOAL #1   Title  "Pt will decrease  scores on NIH-CPSI from   44% to <22    % in order to restore pelvic  floor functions    Time  10    Period  Weeks    Status  New    Target Date  08/24/19      PT LONG TERM GOAL #2   Title  Pt will demo less flexed coccyx across 2 weeks in order to restore nutation of sacrum and longer stride length in gait    Time  4    Period  Weeks    Status  New    Target Date  07/14/19      PT LONG TERM GOAL #3   Title  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 in order to participate in functional tasks    Time  8    Period  Weeks    Status  New    Target Date  08/11/19      PT LONG TERM GOAL #4   Title  Pt will demo proper deep core coordination with proper diaphragmatic excursion in order to optimize postural stability and pelvic floor function    Time  2    Period  Weeks    Status  New    Target Date  06/30/19      PT LONG TERM GOAL #5   Title  Pt will demo increased strength at hip ext B from 3+/5 to >4+/5     in order to progress to fitness activities with less risk for injuries    Time  8    Period  Weeks    Status  New    Target Date  08/11/19            Plan - 07/20/19 1645    Clinical Impression Statement  Pt's urinary frequency has resolved. Focused on  pt's R glut pain.  Provided shoe lift in R shoe to account for R lowered leg length. Manual Tx provided to decrease L paraspinal mm tightness and hypomobility in minor curve L1-3. 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 Provided HEP stretches to counteract mm overuse from golfing. Added more glut strengthening with mini squats. Educated pt on pivoting on L ballmounds to maintain knee /hip alignment when swinging golf club.  Encouraged pt to return to golfing but to play half game. Pt reported no glut pain post Tx.   Pt continues to benefit from skilled PT.    Personal Factors and Comorbidities  Age;Fitness    Examination-Activity Limitations  Loss adjuster, chartered;Shop    Stability/Clinical Decision Making  Stable/Uncomplicated    Rehab Potential  Good    PT Frequency  1x / week    PT Duration  --   10   PT Treatment/Interventions  Neuromuscular re-education;Moist Heat;Therapeutic activities;Functional mobility training;Therapeutic exercise;Patient/family education;Manual techniques;Manual lymph drainage;Taping;Balance training;Gait training    Consulted and Agree with Plan of Care  Patient       Patient will benefit from skilled therapeutic intervention in order to improve the following deficits and impairments:  Decreased coordination, Decreased range of motion, Decreased endurance, Decreased balance, Decreased mobility, Decreased strength, Postural dysfunction, Improper body mechanics, Decreased safety awareness, Increased muscle spasms, Hypomobility  Visit Diagnosis: Sacrococcygeal disorders, not elsewhere classified  Muscle weakness (generalized)  Other lack of coordination     Problem List Patient Active Problem List   Diagnosis Date Noted  . Other male erectile dysfunction 05/31/2019  . History of normocytic normochromic anemia 01/11/2019  . DNR (do not resuscitate) 01/07/2017  . Borderline  diabetes mellitus  09/10/2014  . Essential hypertension 05/10/2014  . BPH (benign prostatic hyperplasia) 11/11/2012    Jerl Mina ,PT, DPT, E-RYT  07/20/2019, 4:55 PM  [place] MAIN [facility] SERVICES [address] [place], [place], [address] Phone: [phone]   Fax:  [phone]  Name: James Oconnor MRN: [id number] Date of Birth: 01/02/1930

## 2019-07-27 ENCOUNTER — Ambulatory Visit: Payer: Medicare HMO | Admitting: Physical Therapy

## 2019-07-27 ENCOUNTER — Other Ambulatory Visit: Payer: Self-pay

## 2019-07-27 DIAGNOSIS — M6281 Muscle weakness (generalized): Secondary | ICD-10-CM

## 2019-07-27 DIAGNOSIS — M533 Sacrococcygeal disorders, not elsewhere classified: Secondary | ICD-10-CM | POA: Diagnosis not present

## 2019-07-27 DIAGNOSIS — R278 Other lack of coordination: Secondary | ICD-10-CM

## 2019-07-27 NOTE — Patient Instructions (Signed)
L sided exercises this week:   Lengthen Back rib by _ shoulder    Pull L arm overhead over mattress, grab the edge of mattress,pull it upward, drawing elbow away from ears  Breathing 10reps  Open Book laying on R side  See hand out   Wall lean for scoliosis   Forearm slide against wall as you stand perpendicular to wall, band under feet, feet hip width apart,  Opposite elbow by side, , imagine holding pencil under armpit as you lean toward wall, lowering forearm against the wall and opposite hand pulls band without letting elbow move away from side body  And slide back up , straightening body    Make sure the upper trapezius muscle does not hike up to ear as band is being pulled as you lean towards wall    band  10 x 2    Side bend L only as you pull band  to open the L flank area

## 2019-07-27 NOTE — Therapy (Signed)
[place] MAIN [facility] SERVICES [address] [place], [place], [address] Phone: [phone]   Fax:  [phone]  Physical Therapy Treatment  Patient Details  Name: James Oconnor MRN: [id number] Date of Birth: May 18, 1930 Referring Provider (PT): Zara Council    Encounter Date: 07/27/2019  PT End of Session - 07/27/19 2341    Visit Number  5    Number of Visits  10    Date for PT Re-Evaluation  08/24/19    PT Start Time  1103    PT Stop Time  1200    PT Time Calculation (min)  57 min    Activity Tolerance  Patient tolerated treatment well    Behavior During Therapy  [facility] for tasks assessed/performed       Past Medical History:  Diagnosis Date  . Diabetes mellitus without complication ([place])   . Hypertension     Past Surgical History:  Procedure Laterality Date  . none      There were no vitals filed for this visit.  Subjective Assessment - 07/27/19 1108    Subjective  Pt had no increased pain wearing his shoe lift but there is no change to his R glut pain. 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 Recent orthopedic Hx:  Pt pulled his R hamstring in Jan 2020.  He also had a fall in 2013 after falling off a ladder and landing R buttock. Xray showed sciatic nerve problem. Pt is undergoing Physical Therapy and will be d/c next Wednesday because he is 100% better. Denied surgeries , injuries except for the fall in Jan 2013    Patient Stated Goals  reduce the urgency to urinate         [facility] PT Assessment - 07/27/19 1109      Observation/Other Assessments   Observations  minor cues for slumped sitting      Strength   Overall Strength Comments  B hip ext 4-/5        Palpation   Spinal mobility  slight convex at upper lumbar curve     SI assessment   iliac crest levelled     Palpation comment  increased tightness along L interspinals, hypomobility T10-L2        Bed Mobility   Bed Mobility  --   no abdominal bulging with bed mobility     Ambulation/Gait   Gait Comments  shoe lift in, R pelvic sway                    [facility] Adult PT Treatment/Exercise - 07/27/19 1109      Neuro Re-ed    Neuro Re-ed Details   alignment and technique with scoliosis specific exercises into HEP, cued for proper technique with deep core level 2      Moist Heat Therapy   Number Minutes Moist Heat  10 Minutes    Moist Heat Location  --   thoracic, during review with deep core level 2      Manual Therapy   Manual therapy comments  PA mob along T10-L1 PA mob Grade III,  L  interspinal mm with MWM                    PT Long Term Goals - 07/27/19 2354      PT LONG TERM GOAL #1   Title  "Pt will decrease  scores  on NIH-CPSI from   44% to <22    % in order to restore pelvic floor functions    Time  10    Period  Weeks    Status  On-going      PT LONG TERM GOAL #2   Title  Pt will demo less flexed coccyx across 2 weeks in order to restore nutation of sacrum and longer stride length in gait    Time  4    Period  Weeks    Status  Achieved      PT LONG TERM GOAL #3   Title  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 in order to participate in functional tasks    Time  8    Period  Weeks    Status  Achieved      PT LONG TERM GOAL #4   Title  Pt will demo proper deep core coordination with proper diaphragmatic excursion in order to optimize postural stability and pelvic floor function    Time  2    Period  Weeks    Status  Achieved      PT LONG TERM GOAL #5   Title  Pt will demo increased strength at hip ext B from 3+/5 to >4+/5     in order to progress to fitness activities with less risk for injuries    Time  8    Period  Weeks    Status  On-going      Additional Long Term Goals   Additional Long Term Goals  Yes      PT LONG TERM GOAL #6   Title  Pt will be referred to sleep study to screen for OSA to see if  nocturia is associated with OSA to improve quality of sleep    Time  4    Period  Weeks    Status  New    Target Date  08/24/19            Plan - 07/27/19 2348    Clinical Impression Statement  Pt's urgency has improved but his nocturia is still 3 x night.Discussed with pt about getting screened for OSA and the association between OSA and nocturia.  Plan to communicate with PCP re: sleep study .    Pt's remaining issue is R glut which continues to remain unresolved despite prior PT at another clinic. Pt's coccyx no longer is flexed and his SIJ mobility has been maintained from past Tx and compliance with wearing shoe lift.  Focused today on scoliotic thoracic curve with manual Tx to increase mobility and decrease mm tensions. Initiated scoliosis specific exercises. Pt demo'd correctly. Pt's pain decreased from 7/10 to 0/10. Pt's gait showed significantly less pelvic sway to R, trunk lean R, and increased stride following Tx.   Pt continues to benefit from skilled PT    Personal Factors and Comorbidities  Age;Fitness    Examination-Activity Limitations  Loss adjuster, chartered;Shop    Stability/Clinical Decision Making  Stable/Uncomplicated    Rehab Potential  Good    PT Frequency  1x / week    PT Duration  --   10   PT Treatment/Interventions  Neuromuscular re-education;Moist Heat;Therapeutic activities;Functional mobility training;Therapeutic exercise;Patient/family education;Manual techniques;Manual lymph drainage;Taping;Balance training;Gait training    Consulted and Agree with Plan of Care  Patient       Patient will benefit from skilled therapeutic intervention in order to improve the following deficits  and impairments:  Decreased coordination, Decreased range of motion, Decreased endurance, Decreased balance, Decreased mobility, Decreased strength, Postural dysfunction, Improper body mechanics, Decreased safety awareness, Increased muscle  spasms, Hypomobility  Visit Diagnosis: No diagnosis found.     Problem List Patient Active Problem List   Diagnosis Date Noted  . Other male erectile dysfunction 05/31/2019  . History of normocytic normochromic anemia 01/11/2019  . DNR (do not resuscitate) 01/07/2017  . Borderline diabetes mellitus 09/10/2014  . Essential hypertension 05/10/2014  . BPH (benign prostatic hyperplasia) 11/11/2012    Jerl Mina ,PT, DPT, E-RYT  07/27/2019, 11:56 PM  [place] MAIN [facility] SERVICES [address] [place], [place], [address] Phone: [phone]   Fax:  [phone]  Name: Daimien Vanderzee MRN: [id number] Date of Birth: 1930-10-29

## 2019-08-03 ENCOUNTER — Other Ambulatory Visit: Payer: Self-pay

## 2019-08-03 ENCOUNTER — Ambulatory Visit: Payer: Medicare HMO | Attending: Urology | Admitting: Physical Therapy

## 2019-08-03 DIAGNOSIS — M533 Sacrococcygeal disorders, not elsewhere classified: Secondary | ICD-10-CM | POA: Diagnosis present

## 2019-08-03 DIAGNOSIS — R278 Other lack of coordination: Secondary | ICD-10-CM

## 2019-08-03 DIAGNOSIS — M6281 Muscle weakness (generalized): Secondary | ICD-10-CM | POA: Insufficient documentation

## 2019-08-03 NOTE — Therapy (Signed)
[place] MAIN [facility] SERVICES [address] [place], [place], [address] Phone: [phone]   Fax:  [phone]  Physical Therapy Treatment  Patient Details  Name: James Oconnor MRN: [id number] Date of Birth: 1930/03/03 Referring Provider (PT): Zara Council    Encounter Date: 08/03/2019  PT End of Session - 08/03/19 1455    Visit Number  6    Number of Visits  10    Date for PT Re-Evaluation  08/24/19    PT Start Time  [id number]    PT Stop Time  1200    PT Time Calculation (min)  57 min    Activity Tolerance  Patient tolerated treatment well    Behavior During Therapy  [facility] for tasks assessed/performed       Past Medical History:  Diagnosis Date  . Diabetes mellitus without complication ([place])   . Hypertension     Past Surgical History:  Procedure Laterality Date  . none      There were no vitals filed for this visit.  Subjective Assessment - 08/03/19 1110    Subjective  Pt no longer has R glut pain. There is pain in the R hamstring when he walks.    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 Recent orthopedic Hx:  Pt pulled his R hamstring in Jan 2020.  He also had a fall in 2013 after falling off a ladder and landing R buttock. Xray showed sciatic nerve problem. Pt is undergoing Physical Therapy and will be d/c next Wednesday because he is 100% better. Denied surgeries , injuries except for the fall in Jan 2013    Patient Stated Goals  reduce the urgency to urinate                    Pelvic Floor Special Questions - 08/03/19 1141    External Perineal Exam  no pelvic floor mm tightness       GLut and hamstring strength: 3+/5 B   [facility] Adult PT Treatment/Exercise - 08/03/19 1452      Therapeutic Activites    Therapeutic Activities  --   deiscussed set up for upright position at cycling pedal     Exercises   Exercises  --   stretches with walking. biking ( see pt instructions)      Moist Heat Therapy   Number Minutes Moist Heat  5 Minutes    Moist Heat Location  Cervical;Other (comment)      Manual Therapy   Manual therapy comments  STM/MWM at hamstrings / IT band R                   PT Long Term Goals - 07/27/19 2354      PT LONG TERM GOAL #1   Title  "Pt will decrease  scores on NIH-CPSI from   44% to <22    % in order to restore pelvic floor functions    Time  10    Period  Weeks    Status  On-going      PT LONG TERM GOAL #2   Title  Pt will demo less flexed coccyx across 2 weeks in order to restore nutation of sacrum and longer stride length in gait    Time  4    Period  Weeks    Status  Achieved      PT LONG TERM GOAL #3   Title  Pt will report no  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 2 occassions in order to participate in functional tasks    Time  8    Period  Weeks    Status  Achieved      PT LONG TERM GOAL #4   Title  Pt will demo proper deep core coordination with proper diaphragmatic excursion in order to optimize postural stability and pelvic floor function    Time  2    Period  Weeks    Status  Achieved      PT LONG TERM GOAL #5   Title  Pt will demo increased strength at hip ext B from 3+/5 to >4+/5     in order to progress to fitness activities with less risk for injuries    Time  8    Period  Weeks    Status  On-going      Additional Long Term Goals   Additional Long Term Goals  Yes      PT LONG TERM GOAL #6   Title  Pt will be referred to sleep study to screen for OSA to see if nocturia is associated with OSA to improve quality of sleep    Time  4    Period  Weeks    Status  New    Target Date  08/24/19            Plan - 08/03/19 1456    Clinical Impression Statement  Pt responded well to shoe lift in R shoe and manual Tx from last session.Pt demo'd more equal alignment of pelvis and less deviations with gait. Pt reported no more pain at R glut but more at R hamstrings. Mamhual Tx decreased hamstring tightness  but pr had difficulty and pain at anterior thight after that Tx. Further manual Tx was applied to release tightness at [facility] which helped to decrease his pain. Gait improved post Tx. Plan to continue to increase hamstring and glut strengthen at upcoming session. Pt continues to benefit from skilled PT.    Personal Factors and Comorbidities  Age;Fitness    Examination-Activity Limitations  Loss adjuster, chartered;Shop    Stability/Clinical Decision Making  Stable/Uncomplicated    Rehab Potential  Good    PT Frequency  1x / week    PT Duration  --   10   PT Treatment/Interventions  Neuromuscular re-education;Moist Heat;Therapeutic activities;Functional mobility training;Therapeutic exercise;Patient/family education;Manual techniques;Manual lymph drainage;Taping;Balance training;Gait training    Consulted and Agree with Plan of Care  Patient       Patient will benefit from skilled therapeutic intervention in order to improve the following deficits and impairments:  Decreased coordination, Decreased range of motion, Decreased endurance, Decreased balance, Decreased mobility, Decreased strength, Postural dysfunction, Improper body mechanics, Decreased safety awareness, Increased muscle spasms, Hypomobility  Visit Diagnosis: Sacrococcygeal disorders, not elsewhere classified  Muscle weakness (generalized)  Other lack of coordination     Problem List Patient Active Problem List   Diagnosis Date Noted  . Other male erectile dysfunction 05/31/2019  . History of normocytic normochromic anemia 01/11/2019  . DNR (do not resuscitate) 01/07/2017  . Borderline diabetes mellitus 09/10/2014  . Essential hypertension 05/10/2014  . BPH (benign prostatic hyperplasia) 11/11/2012    Jerl Mina 08/03/2019, 2:57 PM  [place] MAIN [facility] SERVICES [address] [place], [place], [address] Phone: [phone]   Fax:   [phone]  Name: James Oconnor MRN: [id number] Date of Birth: [birth year]

## 2019-08-10 ENCOUNTER — Other Ambulatory Visit: Payer: Self-pay

## 2019-08-10 ENCOUNTER — Ambulatory Visit: Payer: Medicare HMO | Admitting: Physical Therapy

## 2019-08-10 DIAGNOSIS — R278 Other lack of coordination: Secondary | ICD-10-CM

## 2019-08-10 DIAGNOSIS — M533 Sacrococcygeal disorders, not elsewhere classified: Secondary | ICD-10-CM | POA: Diagnosis not present

## 2019-08-10 DIAGNOSIS — M6281 Muscle weakness (generalized): Secondary | ICD-10-CM

## 2019-08-10 NOTE — Therapy (Addendum)
[place] MAIN [facility] SERVICES [address] [place], [place], [address] Phone: [phone]   Fax:  [phone]  Physical Therapy Treatment / Progress Note  7 Visit from 06/15/19 - 08/10/19   Patient Details  Name: James Oconnor MRN: [id number] Date of Birth: [birth year] Referring Provider (PT): Zara Council    Encounter Date: 08/10/2019  PT End of Session - 08/10/19 1134    Visit Number  7    Number of Visits     Date for PT Re-Evaluation 10/19/19    PT Start Time  1105    PT Stop Time  1200    PT Time Calculation (min)  55 min    Activity Tolerance  Patient tolerated treatment well    Behavior During Therapy  [facility] for tasks assessed/performed       Past Medical History:  Diagnosis Date  . Diabetes mellitus without complication ([place])   . Hypertension     Past Surgical History:  Procedure Laterality Date  . none      There were no vitals filed for this visit.  Subjective Assessment - 08/10/19 1131    Subjective  Pt reports no pain last week at the glut nor hamstrings. Pt states he is getting at night 3-4 x which has not improved. Pt also is not emptying all the way which causes an increase in frequency.    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 Recent orthopedic Hx:  Pt pulled his R hamstring in Jan 2020.  He also had a fall in 2013 after falling off a ladder and landing R buttock. Xray showed sciatic nerve problem. Pt is undergoing Physical Therapy and will be d/c next Wednesday because he is 100% better. Denied surgeries , injuries except for the fall in Jan 2013    Patient Stated Goals  reduce the urgency to urinate         [facility] PT Assessment - 08/10/19 1202      Observation/Other Assessments   Observations  golfing: supination on outer leg with minor LOB, ankle instability at end of swing.                 Pelvic Floor Special Questions - 08/10/19 1557    External Perineal  Exam  pelvic floor tightness at bulbospongiousus, ischiocavernosus, deep transverse perineal R ( decreased post Tx)        [facility] Adult PT Treatment/Exercise - 08/10/19 1158      Therapeutic Activites    Therapeutic Activities  --   simulated toileting posture: breathing technique      Neuro Re-ed    Neuro Re-ed Details   cued for proper lengthening  of pelvic floor and pelvic tilts to improve complete urination.  Tactile and verbal cues for more transverse arch co-activation with golf swing to minimize supination and LOB, cued for less ab overuse with deep core coordination / pelvic floor          Manual Therapy   Manual therapy comments  STM/MWM at R bulbospongiosus/ deep transverse perineal                    PT Long Term Goals - 07/27/19 2354      PT LONG TERM GOAL #1   Title  "Pt will decrease  scores on NIH-CPSI from   44% to <22    % in order to restore pelvic floor functions    Time  10  Period  Weeks    Status  On-going      PT LONG TERM GOAL #2   Title  Pt will demo less flexed coccyx across 2 weeks in order to restore nutation of sacrum and longer stride length in gait    Time  4    Period  Weeks    Status  Achieved      PT LONG TERM GOAL #3   Title  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 in order to participate in functional tasks    Time  8    Period  Weeks    Status  Achieved      PT LONG TERM GOAL #4   Title  Pt will demo proper deep core coordination with proper diaphragmatic excursion in order to optimize postural stability and pelvic floor function    Time  2    Period  Weeks    Status  Achieved      PT LONG TERM GOAL #5   Title  Pt will demo increased strength at hip ext B from 3+/5 to >4+/5     in order to progress to fitness activities with less risk for injuries    Time  8    Period  Weeks    Status  On-going      Additional Long Term Goals   Additional Long Term Goals  Yes      PT LONG TERM GOAL #6    Title  Pt will be referred to sleep study to screen for OSA to see if nocturia is associated with OSA to improve quality of sleep    Time  4    Period  Weeks    Status  New    Target Date  08/24/19            Plan - 08/10/19 1556    Clinical Impression Statement  Pt has achieved 3/6 goals and progressing well towards remaining goals.  Pt's scoliosis and leg length difference has been addressed with shoe lift, manual Tx, and specific HEP which has rendered more equal alignment of pelvic girdle and significantly decreased pelvic sway and trunk lean. Pt's R glut pain has resolved which was likely associated with leg length difference. Pt's R pelvic floor tightness is also likely due to leg length difference 2/2 scoliosis. External manual Tx has decreased pelvic floor tightness and pt demo'd proper deep core coordination to facilitate proper relaxing of pelvic floor mm. Anticipate this progression will yield improvements with urinary frequency and better emptying of urine. Provided training to improve better technique with golf to co-activate deep core and intrinsic feet muscles to minimize risk of injuries. Anticipate pt is ready to return to his hobby.  Addressing his nocturia of 3-4 x with a referral to PCP for sleep study as pt has risk factors for OSA. Pt consented to undergo sleep study per MD order.  Pt continues to benefit from skilled PT with further focus on pelvic floor issues as orthopedic issues have improved significantly and thus, will render better outcomes for pelvic floor issues.    Nocturia and OSA Citations: Raheem O et al. Clinical predictors of nocturia in the sleep apnea population. Urology Annals. 2014. 6 (1): 31-35.).      Kalman Drape al.  Sleep Apnea and Cardiovascular Disease. Journal of the L-3 Communications of Cardiology. 2008. 52(8).   Chang A et al. Management of Nocturia in the Male. Current Urology Reports.  2015. 16.(3) :485.            Personal  Factors and Comorbidities  Age;Fitness    Examination-Activity Limitations  Loss adjuster, chartered;Shop    Stability/Clinical Decision Making  Stable/Uncomplicated    Rehab Potential  Good    PT Frequency  1x / week    PT Duration  --   10   PT Treatment/Interventions  Neuromuscular re-education;Moist Heat;Therapeutic activities;Functional mobility training;Therapeutic exercise;Patient/family education;Manual techniques;Manual lymph drainage;Taping;Balance training;Gait training    Consulted and Agree with Plan of Care  Patient       Patient will benefit from skilled therapeutic intervention in order to improve the following deficits and impairments:  Decreased coordination, Decreased range of motion, Decreased endurance, Decreased balance, Decreased mobility, Decreased strength, Postural dysfunction, Improper body mechanics, Decreased safety awareness, Increased muscle spasms, Hypomobility  Visit Diagnosis: Sacrococcygeal disorders, not elsewhere classified  Muscle weakness (generalized)  Other lack of coordination     Problem List Patient Active Problem List   Diagnosis Date Noted  . Other male erectile dysfunction 05/31/2019  . History of normocytic normochromic anemia 01/11/2019  . DNR (do not resuscitate) 01/07/2017  . Borderline diabetes mellitus 09/10/2014  . Essential hypertension 05/10/2014  . BPH (benign prostatic hyperplasia) 11/11/2012    Jerl Mina  ,PT, DPT, E-RYT  08/10/2019, 3:59 PM  [place] MAIN [facility] SERVICES [address] [place], [place], [address] Phone: [phone]   Fax:  [phone]  Name: James Oconnor MRN: [id number] Date of Birth: 1930-03-08

## 2019-08-10 NOTE — Patient Instructions (Addendum)
Practice relaxation of pelvic floor with  Breathing "elevator imagery"    when urinating to completely empty  _____   Night-time voiding issue will be addressed with sleep study referral    Research: Raheem O et al. Clinical predictors of nocturia in the sleep apnea population. Urology Annals. 2014. 6 (1): 31-35.   StrikeValue.es  Articles: HDTVGame.dk http://www.taylor.net/   ___  Practice putting golf with more Left foot turned out before the swing During the end of the swing, more pressure on the 1st ballmound to not lose balance nor ankle strain

## 2019-08-11 ENCOUNTER — Telehealth: Payer: Self-pay | Admitting: Physical Therapy

## 2019-08-11 NOTE — Telephone Encounter (Signed)
DPT left a message to Dr. Netty Starring ( PCP) re: pt's risk factors for OSA ( nocturia, HTN, diabetes)  and would benefit from a sleep study.  Staff reported message will be relayed to Dr.

## 2019-08-14 NOTE — Addendum Note (Signed)
Addended by: Jerl Mina on: 08/14/2019 08:45 AM   Modules accepted: Orders

## 2019-08-17 ENCOUNTER — Ambulatory Visit: Payer: Medicare HMO | Admitting: Physical Therapy

## 2019-08-24 ENCOUNTER — Emergency Department: Payer: Medicare HMO

## 2019-08-24 ENCOUNTER — Observation Stay
Admission: EM | Admit: 2019-08-24 | Discharge: 2019-08-25 | Disposition: A | Discharge status: Home / Self Care | Payer: Medicare HMO | Attending: Internal Medicine | Admitting: Internal Medicine

## 2019-08-24 ENCOUNTER — Encounter: Payer: Self-pay | Admitting: Emergency Medicine

## 2019-08-24 ENCOUNTER — Other Ambulatory Visit: Payer: Self-pay

## 2019-08-24 DIAGNOSIS — Z87891 Personal history of nicotine dependence: Secondary | ICD-10-CM | POA: Diagnosis not present

## 2019-08-24 DIAGNOSIS — R079 Chest pain, unspecified: Secondary | ICD-10-CM | POA: Diagnosis present

## 2019-08-24 DIAGNOSIS — Z79899 Other long term (current) drug therapy: Secondary | ICD-10-CM | POA: Diagnosis not present

## 2019-08-24 DIAGNOSIS — N183 Chronic kidney disease, stage 3 (moderate): Secondary | ICD-10-CM | POA: Diagnosis not present

## 2019-08-24 DIAGNOSIS — N281 Cyst of kidney, acquired: Secondary | ICD-10-CM | POA: Insufficient documentation

## 2019-08-24 DIAGNOSIS — I700 Atherosclerosis of aorta: Secondary | ICD-10-CM | POA: Diagnosis not present

## 2019-08-24 DIAGNOSIS — I1310 Hypertensive heart and chronic kidney disease without heart failure, with stage 1 through stage 4 chronic kidney disease, or unspecified chronic kidney disease: Secondary | ICD-10-CM | POA: Diagnosis not present

## 2019-08-24 DIAGNOSIS — J90 Pleural effusion, not elsewhere classified: Secondary | ICD-10-CM | POA: Insufficient documentation

## 2019-08-24 DIAGNOSIS — N179 Acute kidney failure, unspecified: Secondary | ICD-10-CM | POA: Insufficient documentation

## 2019-08-24 DIAGNOSIS — Z20828 Contact with and (suspected) exposure to other viral communicable diseases: Secondary | ICD-10-CM | POA: Insufficient documentation

## 2019-08-24 DIAGNOSIS — I200 Unstable angina: Principal | ICD-10-CM | POA: Insufficient documentation

## 2019-08-24 DIAGNOSIS — D72829 Elevated white blood cell count, unspecified: Secondary | ICD-10-CM | POA: Diagnosis not present

## 2019-08-24 DIAGNOSIS — E1122 Type 2 diabetes mellitus with diabetic chronic kidney disease: Secondary | ICD-10-CM | POA: Insufficient documentation

## 2019-08-24 DIAGNOSIS — N400 Enlarged prostate without lower urinary tract symptoms: Secondary | ICD-10-CM | POA: Diagnosis not present

## 2019-08-24 DIAGNOSIS — IMO0002 Reserved for concepts with insufficient information to code with codable children: Secondary | ICD-10-CM

## 2019-08-24 DIAGNOSIS — Z66 Do not resuscitate: Secondary | ICD-10-CM | POA: Diagnosis not present

## 2019-08-24 DIAGNOSIS — Z8249 Family history of ischemic heart disease and other diseases of the circulatory system: Secondary | ICD-10-CM | POA: Insufficient documentation

## 2019-08-24 LAB — BASIC METABOLIC PANEL
Anion gap: 10 (ref 5–15)
BUN: 24 mg/dL — ABNORMAL HIGH (ref 8–23)
CO2: 25 mmol/L (ref 22–32)
Calcium: 8.5 mg/dL — ABNORMAL LOW (ref 8.9–10.3)
Chloride: 100 mmol/L (ref 98–111)
Creatinine, Ser: 1.27 mg/dL — ABNORMAL HIGH (ref 0.61–1.24)
GFR calc Af Amer: 58 mL/min — ABNORMAL LOW (ref >60)
GFR calc non Af Amer: 50 mL/min — ABNORMAL LOW (ref >60)
Glucose, Bld: 112 mg/dL — ABNORMAL HIGH (ref 70–99)
Potassium: 4.1 mmol/L (ref 3.5–5.1)
Sodium: 135 mmol/L (ref 135–145)

## 2019-08-24 LAB — CBC
HCT: 37 % — ABNORMAL LOW (ref 39.0–52.0)
Hemoglobin: 12.3 g/dL — ABNORMAL LOW (ref 13.0–17.0)
MCH: 27.1 pg (ref 26.0–34.0)
MCHC: 33.2 g/dL (ref 30.0–36.0)
MCV: 81.5 fL (ref 80.0–100.0)
Platelets: 263 10*3/uL (ref 150–400)
RBC: 4.54 MIL/uL (ref 4.22–5.81)
RDW: 13.4 % (ref 11.5–15.5)
WBC: 12.3 10*3/uL — ABNORMAL HIGH (ref 4.0–10.5)
nRBC: 0 % (ref 0.0–0.2)

## 2019-08-24 LAB — PROTIME-INR
INR: 1.2 (ref 0.8–1.2)
Prothrombin Time: 14.8 seconds (ref 11.4–15.2)

## 2019-08-24 LAB — TROPONIN I (HIGH SENSITIVITY)
Troponin I (High Sensitivity): 105 ng/L (ref <18)
Troponin I (High Sensitivity): 80 ng/L — ABNORMAL HIGH (ref <18)

## 2019-08-24 LAB — APTT: aPTT: 35 seconds (ref 24–36)

## 2019-08-24 MED ORDER — ASPIRIN EC 81 MG PO TBEC
81.0000 mg | DELAYED_RELEASE_TABLET | Freq: Every day | ORAL | Status: DC
  Administered 2019-08-25: 11:00:00 81 mg via ORAL
  Filled 2019-08-24: qty 1

## 2019-08-24 MED ORDER — ASPIRIN 81 MG PO CHEW
324.0000 mg | CHEWABLE_TABLET | Freq: Once | ORAL | Status: AC
  Administered 2019-08-24: 15:00:00 324 mg via ORAL
  Filled 2019-08-24: qty 4

## 2019-08-24 MED ORDER — OXYCODONE HCL 5 MG PO TABS: 5.0000 mg | ORAL_TABLET | ORAL | Status: DC | PRN

## 2019-08-24 MED ORDER — NITROGLYCERIN 0.4 MG SL SUBL: 0.4000 mg | SUBLINGUAL_TABLET | SUBLINGUAL | Status: DC | PRN

## 2019-08-24 MED ORDER — POLYETHYLENE GLYCOL 3350 17 G PO PACK: 17.0000 g | PACK | Freq: Every day | ORAL | Status: DC | PRN

## 2019-08-24 MED ORDER — IOHEXOL 350 MG/ML SOLN
75.0000 mL | Freq: Once | INTRAVENOUS | Status: AC | PRN
  Administered 2019-08-24: 15:00:00 75 mL via INTRAVENOUS

## 2019-08-24 MED ORDER — ACETAMINOPHEN 650 MG RE SUPP: 650.0000 mg | Freq: Four times a day (QID) | RECTAL | Status: DC | PRN

## 2019-08-24 MED ORDER — MORPHINE SULFATE (PF) 2 MG/ML IV SOLN: 2.0000 mg | INTRAVENOUS | Status: DC | PRN

## 2019-08-24 MED ORDER — ONDANSETRON HCL 4 MG PO TABS: 4.0000 mg | ORAL_TABLET | Freq: Four times a day (QID) | ORAL | Status: DC | PRN

## 2019-08-24 MED ORDER — ACETAMINOPHEN 325 MG PO TABS: 650.0000 mg | ORAL_TABLET | Freq: Four times a day (QID) | ORAL | Status: DC | PRN

## 2019-08-24 MED ORDER — ATORVASTATIN CALCIUM 20 MG PO TABS
40.0000 mg | ORAL_TABLET | Freq: Every day | ORAL | Status: DC
  Administered 2019-08-24: 40 mg via ORAL
  Filled 2019-08-24: qty 2

## 2019-08-24 MED ORDER — HEPARIN (PORCINE) 25000 UT/250ML-% IV SOLN
1150.0000 [IU]/h | INTRAVENOUS | Status: DC
  Administered 2019-08-24: 15:00:00 1000 [IU]/h via INTRAVENOUS
  Administered 2019-08-25: 1150 [IU]/h via INTRAVENOUS
  Filled 2019-08-24 (×2): qty 250

## 2019-08-24 MED ORDER — LABETALOL HCL 5 MG/ML IV SOLN: 10.0000 mg | INTRAVENOUS | Status: DC | PRN

## 2019-08-24 MED ORDER — LIDOCAINE VISCOUS HCL 2 % MT SOLN
15.0000 mL | Freq: Four times a day (QID) | OROMUCOSAL | Status: DC | PRN
  Filled 2019-08-24: qty 15

## 2019-08-24 MED ORDER — HEPARIN BOLUS VIA INFUSION
4000.0000 [IU] | Freq: Once | INTRAVENOUS | Status: AC
  Administered 2019-08-24: 15:00:00 4000 [IU] via INTRAVENOUS
  Filled 2019-08-24: qty 4000

## 2019-08-24 MED ORDER — NITROGLYCERIN 0.4 MG SL SUBL
0.4000 mg | SUBLINGUAL_TABLET | Freq: Once | SUBLINGUAL | Status: AC
  Administered 2019-08-24: 15:00:00 0.4 mg via SUBLINGUAL
  Filled 2019-08-24: qty 1

## 2019-08-24 MED ORDER — LISINOPRIL 10 MG PO TABS
10.0000 mg | ORAL_TABLET | Freq: Every day | ORAL | Status: DC
  Administered 2019-08-24 – 2019-08-25 (×2): 10 mg via ORAL
  Filled 2019-08-24 (×2): qty 1

## 2019-08-24 MED ORDER — ONDANSETRON HCL 4 MG/2ML IJ SOLN: 4.0000 mg | Freq: Four times a day (QID) | INTRAMUSCULAR | Status: DC | PRN

## 2019-08-24 MED ORDER — SODIUM CHLORIDE 0.9% FLUSH: 3.0000 mL | Freq: Once | INTRAVENOUS | Status: DC

## 2019-08-24 MED ORDER — ALUM & MAG HYDROXIDE-SIMETH 200-200-20 MG/5ML PO SUSP: 30.0000 mL | Freq: Four times a day (QID) | ORAL | Status: DC | PRN

## 2019-08-24 MED ORDER — IPRATROPIUM-ALBUTEROL 0.5-2.5 (3) MG/3ML IN SOLN: 3.0000 mL | Freq: Four times a day (QID) | RESPIRATORY_TRACT | Status: DC | PRN

## 2019-08-24 NOTE — Consult Note (Signed)
[place] for heparin drip management Indication: chest pain/ACS  No Known Allergies  Patient Measurements: Height: 5\' 7"  (170.2 cm) Weight: 178 lb (80.7 kg) IBW/kg (Calculated) : 66.1  Vital Signs: Temp: 98.4 F (36.9 C) (09/24 1327) Temp Source: Oral (09/24 1327) BP: 138/87 (09/24 1327) Pulse Rate: 94 (09/24 1327)  Labs: Recent Labs    08/24/19 1331  HGB 12.3*  HCT 37.0*  PLT 263  CREATININE 1.27*  TROPONINIHS 105*    Estimated Creatinine Clearance: 41.7 mL/min (A) (by C-G formula based on SCr of 1.27 mg/dL (H)).   Medical History: Past Medical History:  Diagnosis Date  . Diabetes mellitus without complication ([place])   . Hypertension     Assessment: 85 y.o. male with a history of diabetes, hypertension who presents to the ED for left-sided chest pain since yesterday. Patient's labs did indicate elevated troponin of 105. He is on no chronic anticoagulation PTA, Hgb 12.3, PLT wnl at baseline, all other baseline labs ordered but not yet resulted  Goal of Therapy:  Heparin level 0.3-0.7 units/ml Monitor platelets by anticoagulation protocol: Yes   Plan:  Give 4000 units bolus x 1 Start heparin infusion at 1000 units/hr Check anti-Xa level in 8 hours and daily while on heparin Continue to monitor H&H and platelets  Dallie Piles, PharmD 08/24/2019,2:39 PM

## 2019-08-24 NOTE — Progress Notes (Signed)
Family Meeting Note  Advance Directive:no  Today a meeting took place with the Patient and spouse.  Patient is able to participate.  The following clinical team members were present during this meeting:MD  The following were discussed:Patient's diagnosis: chest pain, Patient's progosis: Unable to determine and Goals for treatment: Full Code  Additional follow-up to be provided: prn  Time spent during discussion:20 minutes  Evette Doffing, MD

## 2019-08-24 NOTE — Plan of Care (Signed)
  Problem: Education: Goal: Knowledge of General Education information will improve Description: Including pain rating scale, medication(s)/side effects and non-pharmacologic comfort measures Outcome: Progressing   Problem: Health Behavior/Discharge Planning: Goal: Ability to manage health-related needs will improve Outcome: Progressing   Problem: Clinical Measurements: Goal: Ability to maintain clinical measurements within normal limits will improve Outcome: Progressing Goal: Will remain free from infection Outcome: Progressing Goal: Diagnostic test results will improve Outcome: Progressing Note: Troponin on admission 105, 2nd troponin 80 Goal: Respiratory complications will improve Outcome: Progressing Goal: Cardiovascular complication will be avoided Outcome: Progressing Note: Denies chest pain   Problem: Pain Managment: Goal: General experience of comfort will improve Outcome: Progressing   Problem: Education: Goal: Understanding of cardiac disease, CV risk reduction, and recovery process will improve Outcome: Progressing Goal: Individualized Educational Video(s) Outcome: Progressing   Problem: Cardiac: Goal: Ability to achieve and maintain adequate cardiovascular perfusion will improve Outcome: Progressing Note: Denies chest pain

## 2019-08-24 NOTE — Progress Notes (Signed)
Pt admitted from ED with chest pain x 3 days.  A&O x 3.  No distress on ra.  Cardiac monitor applied and verified, pt denies chest pain at this time.  Lungs clear bil.  Heparin gtt infusing rt hand.  SL lt ac flushes well.  Oriented to room and surroundings, denies need at this time, wife at bedside.  CB in reach, SR u x 2.

## 2019-08-24 NOTE — ED Triage Notes (Signed)
Says pain in left chest since yesterday.  Says it is acid and he feels better if he can burp.  Nausea this am. No vomiting.

## 2019-08-24 NOTE — ED Notes (Signed)
Critical troponin 102, MD Williams aware

## 2019-08-24 NOTE — H&P (Addendum)
[facility] [place] NAME: James Oconnor    MR#:  [id number]  DATE OF BIRTH:  1930-09-16  DATE OF ADMISSION:  08/24/2019  PRIMARY CARE PHYSICIAN: Dion Body, MD   REQUESTING/REFERRING PHYSICIAN: Lenise Arena, MD  CHIEF COMPLAINT:   Chief Complaint  Patient presents with  . Chest Pain    HISTORY OF PRESENT ILLNESS:  James Oconnor  is a 85 y.o. male with a known history of hypertension and type 2 diabetes who presented to the ED with chest pain for the last 3 days.  Patient states it started out feeling like "indigestion" and some difficulty swallowing food.  The pain then started to occur on the left side of his chest.  The pain feels like a "pressure".  Last night the pain radiated to his shoulders and started radiating down to the left side of his abdomen.  He is having associated shortness of breath and nausea.  He is not having any associated diaphoresis.  The chest pain is worse with taking a deep breath.  He states he feels shortness of breath with exertion.  No recent long car rides or plane rides.  No recent surgeries.  Patient has not been laying in bed more than usual.  In the ED, vitals were unremarkable. Labs were significant for creatinine 1.27, WBC 12.3, troponin 105. CXR showed mild cardiomegaly and trace bilateral pleural effusions. Patient was given aspirin. Hospitalists were called for admission.  PAST MEDICAL HISTORY:   Past Medical History:  Diagnosis Date  . Diabetes mellitus without complication ([place])   . Hypertension     PAST SURGICAL HISTORY:   Past Surgical History:  Procedure Laterality Date  . none      SOCIAL HISTORY:   Social History   Tobacco Use  . Smoking status: Former Smoker    Years: 60.00    Types: Cigarettes  . Smokeless tobacco: Never Used  . Tobacco comment: quit smoking 18yrs ago  Substance Use Topics  . Alcohol use: Not on file    FAMILY HISTORY:  Older sister-heart  disease  DRUG ALLERGIES:  No Known Allergies  REVIEW OF SYSTEMS:   Review of Systems  Constitutional: Negative for chills, diaphoresis and fever.  HENT: Negative for congestion and sore throat.   Eyes: Negative for blurred vision and double vision.  Respiratory: Positive for shortness of breath. Negative for cough.   Cardiovascular: Positive for chest pain. Negative for leg swelling.  Gastrointestinal: Positive for nausea. Negative for abdominal pain and vomiting.  Genitourinary: Negative for dysuria and urgency.  Musculoskeletal: Negative for back pain and neck pain.  Neurological: Negative for dizziness and headaches.  Psychiatric/Behavioral: Negative for depression. The patient is not nervous/anxious.     MEDICATIONS AT HOME:   Prior to Admission medications   Medication Sig Start Date End Date Taking? Authorizing Provider  lisinopril (PRINIVIL,ZESTRIL) 10 MG tablet Take 10 mg by mouth daily. 12/17/17  Yes [provider]  tadalafil (CIALIS) 5 MG tablet Take 5 mg by mouth as needed. 07/13/19 07/12/20 Yes [provider]  finasteride (PROSCAR) 5 MG tablet Take 1 tablet (5 mg total) by mouth daily. Patient not taking: Reported on 06/09/2019 12/09/18   Zara Council A, PA-C      VITAL SIGNS:  Blood pressure (!) 144/85, pulse 94, temperature 98.4 F (36.9 C), temperature source Oral, resp. rate (!) 33, height 5\' 7"  (1.702 m), weight 80.7 kg, SpO2 96 %.  PHYSICAL EXAMINATION:  Physical  Exam  GENERAL:  85 y.o.-year-old patient lying in the bed with no acute distress.  EYES: Pupils equal, round, reactive to light and accommodation. No scleral icterus. Extraocular muscles intact.  HEENT: Head atraumatic, normocephalic. Oropharynx and nasopharynx clear.  NECK:  Supple, no jugular venous distention. No thyroid enlargement, no tenderness.  LUNGS: + Diminished breath sounds in lung bases bilaterally. + Taking shallow breaths.  + Increased respiratory rate. + Mildly  increased work of breathing CARDIOVASCULAR: tachycardic, regular rhythm, S1, S2 normal. No murmurs, rubs, or gallops.  ABDOMEN: Soft, nontender, nondistended. Bowel sounds present. No organomegaly or mass.  EXTREMITIES: No pedal edema, cyanosis, or clubbing.  NEUROLOGIC: Cranial nerves II through XII are intact. Muscle strength 5/5 in all extremities. Sensation intact. Gait not checked.  PSYCHIATRIC: The patient is alert and oriented x 3.  SKIN: No obvious rash, lesion, or ulcer.   LABORATORY PANEL:   CBC Recent Labs  Lab 08/24/19 1331  WBC 12.3*  HGB 12.3*  HCT 37.0*  PLT 263   ------------------------------------------------------------------------------------------------------------------  Chemistries  Recent Labs  Lab 08/24/19 1331  NA 135  K 4.1  CL 100  CO2 25  GLUCOSE 112*  BUN 24*  CREATININE 1.27*  CALCIUM 8.5*   ------------------------------------------------------------------------------------------------------------------  Cardiac Enzymes No results for input(s): TROPONINI in the last 168 hours. ------------------------------------------------------------------------------------------------------------------  RADIOLOGY:  Dg Chest 2 View  Result Date: 08/24/2019 CLINICAL DATA:  Left-sided chest pain since yesterday. EXAM: CHEST - 2 VIEW COMPARISON:  None. FINDINGS: Mild cardiomegaly. Atherosclerotic calcification of the aortic arch. Normal pulmonary vascularity. Bibasilar atelectasis/scarring. Trace bilateral pleural effusions. No consolidation or pneumothorax. No acute osseous abnormality. IMPRESSION: 1. Mild cardiomegaly with trace bilateral pleural effusions. 2. Bibasilar atelectasis/scarring. 3.  Aortic atherosclerosis (ICD10-I70.0). Electronically Signed   By: Titus Dubin M.D.   On: 08/24/2019 14:08      IMPRESSION AND PLAN:   Chest pain- concern for possible PE, given his associated tachycardia, tachypnea, shortness of breath, and pain that is  worse with taking a deep breath. Could also be cardiac in etiology. Initial troponin 105. -CXR negative -Will order CTA chest -ECHO -Cardiology consult -Trend troponins -Received aspirin 324mg  in the ED, will continue aspirin 81mg  daily -Check lipid panel -Start lipitor 40mg  daily  Shortness of breath- likely related to above. Patient is a former smoker, but has not smoked in years. No wheezing to suggest undiagnosed COPD. -Treatment as above -CTA chest pending -Duonebs prn  Hypertension- BP mildly elevated in the ED -Continue home lisinopril -IV labetalol prn  Prediabetes- last A1c 07/06/19 was 5.6 -Monitor -Will hold off on adding SSI for now, as blood sugar is normal in the ED  CKD III- creatinine is at his baseline of 1.0-1.3. -Monitor -Avoid nephrotoxic agents  Leukocytosis- likely reactive. No signs of infection. -Monitor  All the records are reviewed and case discussed with ED provider. Management plans discussed with the patient, family and they are in agreement.  CODE STATUS: Full  TOTAL TIME TAKING CARE OF THIS PATIENT: 45 minutes.    Berna Spare Mayo M.D on 08/24/2019 at 2:52 PM  Between 7am to 6pm - Pager [phone]  After 6pm go to [email or web address] - Proofreader  [facility]  Office  [phone]  CC: Primary care physician; Dion Body, MD   Note: This dictation was prepared with Dragon dictation along with smaller phrase technology. Any transcriptional errors that result from this process are unintentional.

## 2019-08-24 NOTE — ED Provider Notes (Addendum)
[facility] Emergency Department Provider Note       Time seen: ----------------------------------------- 2:31 PM on 08/24/2019 -----------------------------------------   I have reviewed the triage vital signs and the nursing notes.  HISTORY   Chief Complaint Chest Pain    HPI James Oconnor is a 85 y.o. male with a history of diabetes, hypertension who presents to the ED for left-sided chest pain since yesterday.  Patient states it feels like acid and states he thinks he would feel better if he could burp.  He has had nausea this morning but no vomiting.  Discomfort is 6 out of 10.  Past Medical History:  Diagnosis Date  . Diabetes mellitus without complication ([place])   . Hypertension     Patient Active Problem List   Diagnosis Date Noted  . Other male erectile dysfunction 05/31/2019  . History of normocytic normochromic anemia 01/11/2019  . DNR (do not resuscitate) 01/07/2017  . Borderline diabetes mellitus 09/10/2014  . Essential hypertension 05/10/2014  . BPH (benign prostatic hyperplasia) 11/11/2012    Past Surgical History:  Procedure Laterality Date  . none      Allergies Patient has no known allergies.  Social History Social History   Tobacco Use  . Smoking status: Former Smoker    Years: 60.00    Types: Cigarettes  . Smokeless tobacco: Never Used  . Tobacco comment: quit smoking 31yrs ago  Substance Use Topics  . Alcohol use: Not on file  . Drug use: Not on file   Review of Systems Constitutional: Negative for fever. Cardiovascular: Positive for chest pain Respiratory: Negative for shortness of breath. Gastrointestinal: Negative for abdominal pain, vomiting and diarrhea. Musculoskeletal: Negative for back pain. Skin: Negative for rash. Neurological: Negative for headaches, focal weakness or numbness.  All systems negative/normal/unremarkable except as stated in the  HPI  ____________________________________________   PHYSICAL EXAM:  VITAL SIGNS: ED Triage Vitals  Enc Vitals Group     BP 08/24/19 1327 138/87     Pulse Rate 08/24/19 1327 94     Resp 08/24/19 1327 16     Temp 08/24/19 1327 98.4 F (36.9 C)     Temp Source 08/24/19 1327 Oral     SpO2 08/24/19 1327 100 %     Weight 08/24/19 1328 178 lb (80.7 kg)     Height 08/24/19 1328 5\' 7"  (1.702 m)     Head Circumference --      Peak Flow --      Pain Score 08/24/19 1327 6     Pain Loc --      Pain Edu? --      Excl. in [place]? --     Constitutional: Alert and oriented. Well appearing and in no distress. Eyes: Conjunctivae are normal. Normal extraocular movements. ENT      Head: Normocephalic and atraumatic.      Nose: No congestion/rhinnorhea.      Mouth/Throat: Mucous membranes are moist.      Neck: No stridor. Cardiovascular: Normal rate, regular rhythm. No murmurs, rubs, or gallops. Respiratory: Normal respiratory effort without tachypnea nor retractions. Breath sounds are clear and equal bilaterally. No wheezes/rales/rhonchi. Gastrointestinal: Soft and nontender. Normal bowel sounds Musculoskeletal: Nontender with normal range of motion in extremities. No lower extremity tenderness nor edema. Neurologic:  Normal speech and language. No gross focal neurologic deficits are appreciated.  Skin:  Skin is warm, dry and intact. No rash noted. Psychiatric: Mood and affect are normal. Speech and behavior are normal.  ____________________________________________  EKG: Interpreted by me.  Sinus rhythm with rate of 95 bpm, prolonged PR interval, LVH, normal QT, nonspecific ST segment changes  ____________________________________________  ED COURSE:  As part of my medical decision making, I reviewed the following data within the [place] History obtained from family if available, nursing notes, old chart and ekg, as well as notes from prior ED visits. Patient presented for  chest pain, we will assess with labs and imaging as indicated at this time.   Procedures  [name] was evaluated in Emergency Department on 08/24/2019 for the symptoms described in the history of present illness. He was evaluated in the context of the global COVID-19 pandemic, which necessitated consideration that the patient might be at risk for infection with the SARS-CoV-2 virus that causes COVID-19. Institutional protocols and algorithms that pertain to the evaluation of patients at risk for COVID-19 are in a state of rapid change based on information released by regulatory bodies including the CDC and federal and state organizations. These policies and algorithms were followed during the patient's care in the ED.  ____________________________________________   LABS (pertinent positives/negatives)  Labs Reviewed  BASIC METABOLIC PANEL - Abnormal; Notable for the following components:      Result Value   Glucose, Bld 112 (*)    BUN 24 (*)    Creatinine, Ser 1.27 (*)    Calcium 8.5 (*)    GFR calc non Af Amer 50 (*)    GFR calc Af Amer 58 (*)    All other components within normal limits  CBC - Abnormal; Notable for the following components:   WBC 12.3 (*)    Hemoglobin 12.3 (*)    HCT 37.0 (*)    All other components within normal limits  TROPONIN I (HIGH SENSITIVITY) - Abnormal; Notable for the following components:   Troponin I (High Sensitivity) 105 (*)    All other components within normal limits   CRITICAL CARE Performed by: Laurence Aly   Total critical care time: 30 minutes  Critical care time was exclusive of separately billable procedures and treating other patients.  Critical care was necessary to treat or prevent imminent or life-threatening deterioration.  Critical care was time spent personally by me on the following activities: development of treatment plan with patient and/or surrogate as well as nursing, discussions with consultants, evaluation of  patient's response to treatment, examination of patient, obtaining history from patient or surrogate, ordering and performing treatments and interventions, ordering and review of laboratory studies, ordering and review of radiographic studies, pulse oximetry and re-evaluation of patient's condition.  RADIOLOGY Images were viewed by me  Chest x-ray IMPRESSION: 1. Mild cardiomegaly with trace bilateral pleural effusions. 2. Bibasilar atelectasis/scarring. 3.  Aortic atherosclerosis (ICD10-I70.0). ____________________________________________   DIFFERENTIAL DIAGNOSIS   Unstable angina, MI, GERD, peptic ulcer disease, PE  FINAL ASSESSMENT AND PLAN  Chest pain, elevated troponin   Plan: The patient had presented for chest pain that he thought was acid related. Patient's labs did indicate elevated troponin of 105. Patient's imaging did not reveal any acute process.  Patient was given aspirin and 1 sublingual nitroglycerin and we will place him on a heparin drip.  I will discuss with cardiology and the hospitalist service for admission.   Laurence Aly, MD    Note: This note was generated in part or whole with voice recognition software. Voice recognition is usually quite accurate but there are transcription errors that can and very often do occur. I  apologize for any typographical errors that were not detected and corrected.     Earleen Newport, MD 08/24/19 1437    Earleen Newport, MD 09/06/19 [phone]

## 2019-08-25 ENCOUNTER — Ambulatory Visit: Payer: Medicare HMO | Admitting: Physical Therapy

## 2019-08-25 ENCOUNTER — Inpatient Hospital Stay (HOSPITAL_COMMUNITY)
Admit: 2019-08-25 | Discharge: 2019-08-25 | Disposition: A | Discharge status: Home / Self Care | Payer: Medicare HMO | Attending: Internal Medicine | Admitting: Internal Medicine

## 2019-08-25 ENCOUNTER — Inpatient Hospital Stay: Payer: Medicare HMO

## 2019-08-25 ENCOUNTER — Encounter: Payer: Self-pay | Admitting: Physician Assistant

## 2019-08-25 DIAGNOSIS — N2889 Other specified disorders of kidney and ureter: Secondary | ICD-10-CM | POA: Diagnosis not present

## 2019-08-25 DIAGNOSIS — I340 Nonrheumatic mitral (valve) insufficiency: Secondary | ICD-10-CM

## 2019-08-25 DIAGNOSIS — I248 Other forms of acute ischemic heart disease: Secondary | ICD-10-CM

## 2019-08-25 DIAGNOSIS — J40 Bronchitis, not specified as acute or chronic: Secondary | ICD-10-CM | POA: Diagnosis not present

## 2019-08-25 DIAGNOSIS — I200 Unstable angina: Secondary | ICD-10-CM | POA: Diagnosis not present

## 2019-08-25 DIAGNOSIS — R079 Chest pain, unspecified: Secondary | ICD-10-CM

## 2019-08-25 LAB — BASIC METABOLIC PANEL
Anion gap: 12 (ref 5–15)
BUN: 21 mg/dL (ref 8–23)
CO2: 23 mmol/L (ref 22–32)
Calcium: 8.2 mg/dL — ABNORMAL LOW (ref 8.9–10.3)
Chloride: 103 mmol/L (ref 98–111)
Creatinine, Ser: 1.19 mg/dL (ref 0.61–1.24)
GFR calc Af Amer: >60 mL/min (ref >60)
GFR calc non Af Amer: 55 mL/min — ABNORMAL LOW (ref >60)
Glucose, Bld: 140 mg/dL — ABNORMAL HIGH (ref 70–99)
Potassium: 4 mmol/L (ref 3.5–5.1)
Sodium: 138 mmol/L (ref 135–145)

## 2019-08-25 LAB — CBC
HCT: 37.8 % — ABNORMAL LOW (ref 39.0–52.0)
Hemoglobin: 12.7 g/dL — ABNORMAL LOW (ref 13.0–17.0)
MCH: 27.1 pg (ref 26.0–34.0)
MCHC: 33.6 g/dL (ref 30.0–36.0)
MCV: 80.6 fL (ref 80.0–100.0)
Platelets: 301 10*3/uL (ref 150–400)
RBC: 4.69 MIL/uL (ref 4.22–5.81)
RDW: 13.4 % (ref 11.5–15.5)
WBC: 15.4 10*3/uL — ABNORMAL HIGH (ref 4.0–10.5)
nRBC: 0 % (ref 0.0–0.2)

## 2019-08-25 LAB — HEPARIN LEVEL (UNFRACTIONATED): Heparin Unfractionated: 0.11 IU/mL — ABNORMAL LOW (ref 0.30–0.70)

## 2019-08-25 LAB — LIPID PANEL
Cholesterol: 144 mg/dL (ref 0–200)
HDL: 55 mg/dL (ref >40)
LDL Cholesterol: 74 mg/dL (ref 0–99)
Total CHOL/HDL Ratio: 2.6 RATIO
Triglycerides: 75 mg/dL (ref <150)
VLDL: 15 mg/dL (ref 0–40)

## 2019-08-25 LAB — SARS CORONAVIRUS 2 (TAT 6-24 HRS): SARS Coronavirus 2: NEGATIVE

## 2019-08-25 LAB — ECHOCARDIOGRAM COMPLETE
Height: 67 in
Weight: 2790.14 oz

## 2019-08-25 MED ORDER — HEPARIN SODIUM (PORCINE) 5000 UNIT/ML IJ SOLN: 5000.0000 [IU] | Freq: Three times a day (TID) | INTRAMUSCULAR | Status: DC

## 2019-08-25 MED ORDER — DOXYCYCLINE HYCLATE 100 MG PO TABS: 100.0000 mg | ORAL_TABLET | Freq: Two times a day (BID) | ORAL | 0 refills | Status: DC

## 2019-08-25 MED ORDER — HEPARIN BOLUS VIA INFUSION
2500.0000 [IU] | Freq: Once | INTRAVENOUS | Status: AC
  Administered 2019-08-25: 03:00:00 2500 [IU] via INTRAVENOUS
  Filled 2019-08-25: qty 2500

## 2019-08-25 MED ORDER — DOXYCYCLINE HYCLATE 100 MG PO TABS
100.0000 mg | ORAL_TABLET | Freq: Two times a day (BID) | ORAL | Status: DC
  Administered 2019-08-25: 100 mg via ORAL
  Filled 2019-08-25: qty 1

## 2019-08-25 MED ORDER — GADOBUTROL 1 MMOL/ML IV SOLN
7.0000 mL | Freq: Once | INTRAVENOUS | Status: AC | PRN
  Administered 2019-08-25: 7 mL via INTRAVENOUS

## 2019-08-25 NOTE — Consult Note (Signed)
Cardiology Consultation:   Patient ID: James Oconnor; [id number]; 1930-03-18   Admit date: 08/24/2019 Date of Consult: 08/25/2019  Primary Care Provider: Dion Body, MD Primary Cardiologist: New to [facility] - consult by [place]   Patient Profile:   James Oconnor is a 85 y.o. male with a hx of DM2 and HTN who is being seen today for the evaluation of chest pain and shortness of breath at the request of Dr. Brett Albino.  History of Present Illness:   James Oconnor has no previously known cardiac history.  Both the patient and his wife have been under the weather for the past month with increased cough productive of green to yellow sputum and subjective fevers.  Symptoms have been worsening patient when compared to his wife.  Patient also noted some difficulty in swallowing food with associated left-sided chest discomfort and increased shortness of breath with nausea.  In this setting, he presented to [facility] with initial high-sensitivity troponin I 05 with a delta of 80, serum creatinine 1.27 improved to 1.19, potassium 4.1, WBC 12.3 trending to 15.4, Hgb 12.3, EKG showing nonspecific ST-T changes as below, chest x-ray showing mild cardiomegaly with trace bilateral pleural effusions, bibasilar atelectasis, and aortic atherosclerosis.  CTA chest showed no PE with small bilateral pleural effusions as well as mild aortic atherosclerosis and a 2.5 cm isodense lesion of the left kidney.  Vitals stable.  Patient was started on heparin drip upon admission.  Currently without complaints.    Past Medical History:  Diagnosis Date  . Diabetes mellitus without complication ([place])   . Hypertension     Past Surgical History:  Procedure Laterality Date  . none       Home Meds: Prior to Admission medications   Medication Sig Start Date End Date Taking? Authorizing Provider  lisinopril (PRINIVIL,ZESTRIL) 10 MG tablet Take 10 mg by mouth daily. 12/17/17  Yes [provider]  tadalafil (CIALIS) 5 MG  tablet Take 5 mg by mouth as needed. 07/13/19 07/12/20 Yes [provider]  finasteride (PROSCAR) 5 MG tablet Take 1 tablet (5 mg total) by mouth daily. Patient not taking: Reported on 06/09/2019 12/09/18   Nori Riis, PA-C    Inpatient Medications: Scheduled Meds: . aspirin EC  81 mg Oral Daily  . atorvastatin  40 mg Oral q1800  . heparin  5,000 Units Subcutaneous Q8H  . lisinopril  10 mg Oral Daily  . sodium chloride flush  3 mL Intravenous Once   Continuous Infusions:  PRN Meds: acetaminophen **OR** acetaminophen, alum & mag hydroxide-simeth **AND** lidocaine, ipratropium-albuterol, labetalol, morphine injection, nitroGLYCERIN, ondansetron **OR** ondansetron (ZOFRAN) IV, oxyCODONE, polyethylene glycol  Allergies:  No Known Allergies  Social History:   Social History   Socioeconomic History  . Marital status: Married    Spouse name: Not on file  . Number of children: 4  . Years of education: Not on file  . Highest education level: Not on file  Occupational History  . Not on file  Social Needs  . Financial resource strain: Not on file  . Food insecurity    Worry: Not on file    Inability: Not on file  . Transportation needs    Medical: Not on file    Non-medical: Not on file  Tobacco Use  . Smoking status: Former Smoker    Years: 60.00    Types: Cigarettes  . Smokeless tobacco: Never Used  . Tobacco comment: quit smoking 18yrs ago  Substance and Sexual Activity  .  Alcohol use: Not on file  . Drug use: Not on file  . Sexual activity: Not on file  Lifestyle  . Physical activity    Days per week: Not on file    Minutes per session: Not on file  . Stress: Not on file  Relationships  . Social Herbalist on phone: Not on file    Gets together: Not on file    Attends religious service: Not on file    Active member of club or organization: Not on file    Attends meetings of clubs or organizations: Not on file    Relationship status: Not on  file  . Intimate partner violence    Fear of current or ex partner: Not on file    Emotionally abused: Not on file    Physically abused: Not on file    Forced sexual activity: Not on file  Other Topics Concern  . Not on file  Social History Narrative  . Not on file     Family History:   Family History  Problem Relation Age of Onset  . Lung cancer Father   . Heart disease Sister   . Lung cancer Brother     ROS:  Review of Systems  Constitutional: Positive for chills, fever and malaise/fatigue. Negative for diaphoresis and weight loss.  HENT: Negative for congestion.   Eyes: Negative for discharge and redness.  Respiratory: Positive for cough, sputum production and shortness of breath. Negative for hemoptysis and wheezing.        Green to yellow sputum  Cardiovascular: Positive for chest pain. Negative for palpitations, orthopnea, claudication, leg swelling and PND.  Gastrointestinal: Positive for heartburn. Negative for abdominal pain, blood in stool, melena, nausea and vomiting.  Genitourinary: Negative for hematuria.  Musculoskeletal: Negative for falls and myalgias.  Skin: Negative for rash.  Neurological: Positive for weakness. Negative for dizziness, tingling, tremors, sensory change, speech change, focal weakness and loss of consciousness.  Endo/Heme/Allergies: Does not bruise/bleed easily.  Psychiatric/Behavioral: Negative for substance abuse. The patient is not nervous/anxious.   All other systems reviewed and are negative.     Physical Exam/Data:   Vitals:   08/24/19 2130 08/25/19 0358 08/25/19 0700 08/25/19 0757  BP: (!) 141/87 129/77  (!) 144/79  Pulse: (!) 102 100  96  Resp: 18 17  19   Temp: 98.3 F (36.8 C) 98.7 F (37.1 C)  98 F (36.7 C)  TempSrc: Oral     SpO2: 94% 92%  (!) 88%  Weight:   79.1 kg   Height:        Intake/Output Summary (Last 24 hours) at 08/25/2019 0937 Last data filed at 08/25/2019 0751 Gross per 24 hour  Intake 70.74 ml  Output  920 ml  Net -849.26 ml   Filed Weights   08/24/19 1328 08/25/19 0700  Weight: 80.7 kg 79.1 kg   Body mass index is 27.31 kg/m.   Physical Exam: General: Well developed, well nourished, in no acute distress. Head: Normocephalic, atraumatic, sclera non-icteric, no xanthomas, nares without discharge.  Neck: Negative for carotid bruits. JVD not elevated. Lungs: Coarse breath sounds bilaterally. Breathing is unlabored. Heart: RRR with S1 S2. No murmurs, rubs, or gallops appreciated. Abdomen: Soft, non-tender, non-distended with normoactive bowel sounds. No hepatomegaly. No rebound/guarding. No obvious abdominal masses. Msk:  Strength and tone appear normal for age. Extremities: No clubbing or cyanosis. No edema. Distal pedal pulses are 2+ and equal bilaterally. Neuro: Alert and oriented X 3.  No facial asymmetry. No focal deficit. Moves all extremities spontaneously. Psych:  Responds to questions appropriately with a normal affect.   EKG:  The EKG was personally reviewed and demonstrates: NSR, 95 bpm, LVH, nonspecific ST-T changes Telemetry:  Telemetry was personally reviewed and demonstrates: Sinus rhythm  Weights: Filed Weights   08/24/19 1328 08/25/19 0700  Weight: 80.7 kg 79.1 kg    Relevant CV Studies: Echo pending  Laboratory Data:  Chemistry Recent Labs  Lab 08/24/19 1331 08/25/19 0641  NA 135 138  K 4.1 4.0  CL 100 103  CO2 25 23  GLUCOSE 112* 140*  BUN 24* 21  CREATININE 1.27* 1.19  CALCIUM 8.5* 8.2*  GFRNONAA 50* 55*  GFRAA 58* >60  ANIONGAP 10 12    No results for input(s): PROT, ALBUMIN, AST, ALT, ALKPHOS, BILITOT in the last 168 hours. Hematology Recent Labs  Lab 08/24/19 1331 08/25/19 0641  WBC 12.3* 15.4*  RBC 4.54 4.69  HGB 12.3* 12.7*  HCT 37.0* 37.8*  MCV 81.5 80.6  MCH 27.1 27.1  MCHC 33.2 33.6  RDW 13.4 13.4  PLT 263 301   Cardiac EnzymesNo results for input(s): TROPONINI in the last 168 hours. No results for input(s): TROPIPOC in  the last 168 hours.  BNPNo results for input(s): BNP, PROBNP in the last 168 hours.  DDimer No results for input(s): DDIMER in the last 168 hours.  Radiology/Studies:  Dg Chest 2 View  Result Date: 08/24/2019 IMPRESSION: 1. Mild cardiomegaly with trace bilateral pleural effusions. 2. Bibasilar atelectasis/scarring. 3.  Aortic atherosclerosis (ICD10-I70.0). Electronically Signed   By: Titus Dubin M.D.   On: 08/24/2019 14:08   Ct Angio Chest Pe W Or Wo Contrast  Result Date: 08/24/2019 IMPRESSION: No pulmonary embolus. Small bilateral pleural effusions with atelectasis of the peripheral lungs. No focal pneumonia is noted. Mild prominence of aortic arch diameter measuring 4 cm. 2.5 cm isodense lesion in the left kidney. Although this may represent a hemorrhagic cyst, a solid mass is not excluded. Recommend further evaluation with renal ultrasound. Electronically Signed   By: Abelardo Diesel M.D.   On: 08/24/2019 15:46    Assessment and Plan:   1.  Atypical chest pain/elevated high-sensitivity troponin: -Not consistent with ACS -Discontinue heparin -Await echo -If echo is unrevealing plan for outpatient cardiac evaluation -ASA -Lipitor  2.  Bronchitis: -Suspect symptoms are most consistent with URI with cough productive of green to yellow sputum and symptoms similar to his wife's -Work-up per IM  3.  Leukocytosis: -Likely in the setting of the above -Per IM  4.  HTN: -Continue current therapy  5.  AKI: -Improved   For questions or updates, please contact [facility] Please consult [email or web address] for contact info under Cardiology/STEMI.   Signed, Christell Faith, PA-C [place] Pager: [phone] 08/25/2019, 9:37 AM

## 2019-08-25 NOTE — Discharge Instructions (Signed)
Renal Mass  A renal mass is a growth in the kidney. A renal mass may be found while performing an MRI, CT scan, or ultrasound for other problems of the abdomen. Certain types of cancers, infections, or injuries can cause a renal mass. A renal mass that is cancerous (malignant) may grow or spread quickly. Others are harmless (benign). What are common types of renal masses? Renal masses include:  Tumors. These may be cancerous (malignant) or noncancerous (benign). ? The most common type of kidney cancer is renal cell carcinoma. ? The most common benign tumors of the kidney include renal adenomas, oncocytomas, and angiomyolipoma (AML).  Cysts. These are fluid-filled sacs that form on or in the kidney. ? It is not always known what causes a cyst to develop in or on the kidney. ? Most kidney cysts do not cause symptoms and do not need to be treated. What type of testing might I need? Your health care provider may recommend that you have tests to diagnose the cause of your renal mass. The following tests may be done if a renal mass is found:  Physical exam.  Blood tests.  Urine tests.  Imaging tests, such as ultrasound, CT scan, or MRI.  Biopsy. This is a small sample that is removed from the renal mass and tested in a lab. The exact tests and how often they are done will depend on:  The size and appearance of the renal mass.  Risk factors or medical conditions that increase your risk for problems.  Any symptoms associated with the renal mass, or concerns that you have about it. Tests and physical exams may be done once, or they may be done regularly for a period of time. Tests and exams that are done regularly will help monitor whether the mass is growing and beginning to cause problems. What are common treatments for renal masses? Treatment is not always needed for this condition. Your health care provider may recommend careful monitoring (watchful waiting) and regular tests and exams.  Treatment will depend on the cause of the mass. Follow these instructions at home: What you need to do at home will depend on the cause of the mass. Follow the instructions that your health care provider gives to you. In general:  Take over-the-counter and prescription medicines only as told by your health care provider.  If you are prescribed an antibiotic medicine, take it as told by your health care provider. Do not stop taking the antibiotic even if you start to feel better.  Follow any restrictions that are given to you by your health care provider.  Keep all follow-up visits as told by your health care provider. This is important. ? You may need to see your health care provider once or twice a year to have CT scans and ultrasounds done. These tests will show if your renal mass has changed or grown bigger. Contact a health care provider if you:  Have pain in the side or back (flank pain).  Have a fever.  Feel full soon after eating.  Have pain or swelling in the abdomen.  Lose weight. Get help right away if:  Your pain gets worse.  There is blood in your urine.  You cannot urinate.  You have chest pain.  You have trouble breathing. Summary  A renal mass is a growth in the kidney. It may be cancerous (malignant) and grow or spread quickly, or it may be harmless (benign).  Renal masses may be found while performing  an MRI, CT scan, or ultrasound for other problems of the abdomen.  Your health care provider may recommend that you have tests to diagnose the cause of your renal mass. This may include a physical exam, blood tests, urine tests, imaging, or a biopsy.  Treatment is not always needed for this condition. Careful monitoring (watchful waiting) may be recommended. This information is not intended to replace advice given to you by your health care provider. Make sure you discuss any questions you have with your health care provider. Document Released: 06/13/2014  Document Revised: 12/23/2017 Document Reviewed: 12/23/2017 Elsevier Patient Education  2020 [employer]  

## 2019-08-25 NOTE — Progress Notes (Signed)
MRI abd is worrisome for renal mass/carcinoma.  Case d/w Dr Alyson Ingles who recommends outpt f/up with Dr Cherrie Gauze office.  I've updated patient about this findings and f/up plans

## 2019-08-25 NOTE — Discharge Summary (Signed)
James Oconnor: James Oconnor    MR#:  [id number]  DATE OF BIRTH:  1930/03/08  DATE OF ADMISSION:  08/24/2019   ADMITTING PHYSICIAN: James Hua, MD  DATE OF DISCHARGE: 08/25/2019  6:05 PM  PRIMARY CARE PHYSICIAN: James Body, MD   ADMISSION DIAGNOSIS:  Unstable angina ([place]) [I20.0] DISCHARGE DIAGNOSIS:  Active Problems:   Chest pain  SECONDARY DIAGNOSIS:   Past Medical History:  Diagnosis Date   Diabetes mellitus without complication ([place])    Hypertension    HOSPITAL COURSE:  1.  Chest discomfort, demand ischemia Minimally elevated troponin in the setting of cough, chest congestion, Symptoms most consistent with bronchitis Wife also sick with similar symptoms Cardiac enzymes minimally elevated, echocardiogram with normal ejection fraction, EKG with nonspecific findings, CT scan chest minimal coronary calcification, very minimal aortic atherosclerosis placing him at lower risk of underlying cardiac issue -He reports chest congestion symptoms improved with coughing up sputum in the mornings especially  2.  Bronchitis: -Suspect symptoms are most consistent with URI with cough productive of green to yellow sputum and symptoms similar to his wife's -doxycycline at D/C  3.  Leukocytosis: -Likely in the setting of the above  4.  HTN: -Continue current therapy  5.  AKI: -Improved  6. Difficulty swallowing, food getting stuck, coughing - scheduled for outpt speech and swallow appointment next week  7. Left renal mass: concerning for renal cell carcinoma. outpt Urology f/up DISCHARGE CONDITIONS:  stable CONSULTS OBTAINED:   DRUG ALLERGIES:  No Known Allergies DISCHARGE MEDICATIONS:   Allergies as of 08/25/2019   No Known Allergies     Medication List    STOP taking these medications   finasteride 5 MG tablet Commonly known as: Proscar     TAKE these medications   doxycycline 100 MG  tablet Commonly known as: VIBRA-TABS Take 1 tablet (100 mg total) by mouth every 12 (twelve) hours.   lisinopril 10 MG tablet Commonly known as: ZESTRIL Take 10 mg by mouth daily.   tadalafil 5 MG tablet Commonly known as: CIALIS Take 5 mg by mouth as needed.        DISCHARGE INSTRUCTIONS:   DIET:  Regular diet DISCHARGE CONDITION:  Good ACTIVITY:  Activity as tolerated OXYGEN:  Home Oxygen: No.  Oxygen Delivery: room air DISCHARGE LOCATION:  home   If you experience worsening of your admission symptoms, develop shortness of breath, life threatening emergency, suicidal or homicidal thoughts you must seek medical attention immediately by calling 911 or calling your MD immediately  if symptoms less severe.  You Must read complete instructions/literature along with all the possible adverse reactions/side effects for all the Medicines you take and that have been prescribed to you. Take any new Medicines after you have completely understood and accpet all the possible adverse reactions/side effects.   Please note  You were cared for by a hospitalist during your hospital stay. If you have any questions about your discharge medications or the care you received while you were in the hospital after you are discharged, you can call the unit and asked to speak with the hospitalist on call if the hospitalist that took care of you is not available. Once you are discharged, your primary care physician will handle any further medical issues. Please note that NO REFILLS for any discharge medications will be authorized once you are discharged, as it is imperative that you return to your primary care physician (or  establish a relationship with a primary care physician if you do not have one) for your aftercare needs so that they can reassess your need for medications and monitor your lab values.    On the day of Discharge:  VITAL SIGNS:  Blood pressure 140/90, pulse 95, temperature 99.1 F  (37.3 C), temperature source Oral, resp. rate 19, height 5\' 7"  (1.702 m), weight 79.1 kg, SpO2 93 %. PHYSICAL EXAMINATION:  GENERAL:  85 y.o.-year-old patient lying in the bed with no acute distress.  EYES: Pupils equal, round, reactive to light and accommodation. No scleral icterus. Extraocular muscles intact.  HEENT: Head atraumatic, normocephalic. Oropharynx and nasopharynx clear.  NECK:  Supple, no jugular venous distention. No thyroid enlargement, no tenderness.  LUNGS: Normal breath sounds bilaterally, no wheezing, rales,rhonchi or crepitation. No use of accessory muscles of respiration.  CARDIOVASCULAR: S1, S2 normal. No murmurs, rubs, or gallops.  ABDOMEN: Soft, non-tender, non-distended. Bowel sounds present. No organomegaly or mass.  EXTREMITIES: No pedal edema, cyanosis, or clubbing.  NEUROLOGIC: Cranial nerves II through XII are intact. Muscle strength 5/5 in all extremities. Sensation intact. Gait not checked.  PSYCHIATRIC: The patient is alert and oriented x 3.  SKIN: No obvious rash, lesion, or ulcer.  DATA REVIEW:   CBC Recent Labs  Lab 08/25/19 0641  WBC 15.4*  HGB 12.7*  HCT 37.8*  PLT 301    Chemistries  Recent Labs  Lab 08/25/19 0641  NA 138  K 4.0  CL 103  CO2 23  GLUCOSE 140*  BUN 21  CREATININE 1.19  CALCIUM 8.2*     Follow-up Information    James Espy, MD. Schedule an appointment as soon as possible for a visit in 1 week(s).   Specialty: Urology Contact information: [address] [place] [address] [phone]        James Body, MD. Schedule an appointment as soon as possible for a visit in 5 day(s).   Specialty: Family Medicine Contact information: [place]  [address] [phone]        James Merritts, MD .   Specialty: Cardiology Contact information: [place] [address] [phone]              RADIOLOGY:  Mr Abdomen [name] Contrast  Result Date: 08/25/2019 CLINICAL DATA:  85 year old male with history of renal mass. Follow-up evaluation. EXAM: MRI ABDOMEN WITHOUT AND WITH CONTRAST TECHNIQUE: Multiplanar multisequence MR imaging of the abdomen was performed both before and after the administration of intravenous contrast. CONTRAST:  43mL GADAVIST GADOBUTROL 1 MMOL/ML IV SOLN COMPARISON:  No prior abdominal MRI.  Renal ultrasound 08/25/2019. FINDINGS: Comment: Today's study is limited by extensive patient respiratory motion. Lower chest: Small bilateral pleural effusions lying dependently with signal intensity in the visualize lung bases which likely reflects associated passive atelectasis. Cardiomegaly. Hepatobiliary: In the central aspect of the liver, there is a 8.0 x 7.5 x 6.8 cm T1 hypointense, T2 hyperintense, nonenhancing lesion, compatible with a large simple cyst. No other suspicious hepatic lesions. No intra or extrahepatic biliary ductal dilatation. Gallbladder is normal in appearance. Pancreas: No pancreatic mass. No pancreatic ductal dilatation. No pancreatic or peripancreatic fluid collections or inflammatory changes. Spleen:  Unremarkable. Adrenals/Urinary Tract: In the anterior aspect of the upper pole the left kidney there is a 2.6 x 2.2 x 3.1 cm lesion that is heterogeneous in signal intensity on T1 weighted images (T1 isointense to T1 hyperintense),  isointense on T2 weighted images, with avid arterial phase enhancement and some delayed washout, compatible with a solid renal neoplasm, likely a renal cell carcinoma. This appears encapsulated within Gerota's fascia, and is well separated from the left renal vein, which is widely patent. Multiple other T1 hypointense, T2 hyperintense, nonenhancing lesions are noted in both kidneys, compatible with simple cysts, largest of which is in the posterior aspect of the lower pole the left kidney, compatible with simple cysts. In the  posterior aspect of the interpolar region of the right kidney there is an 8 mm T1 hyperintense, T2 hypointense, nonenhancing lesion, compatible with a small proteinaceous/hemorrhagic cyst (axial image 51 of series 10). No hydroureteronephrosis in the visualized portions of the abdomen. Mild thickening of the adrenal glands bilaterally, likely reflective of mild adrenal hyperplasia. Stomach/Bowel: Visualized portions are unremarkable. Vascular/Lymphatic: Aortic atherosclerosis, without evidence of aneurysm in the abdominal vasculature. No lymphadenopathy noted in the abdomen. Other: No significant volume of ascites noted in the visualized portions of the peritoneal cavity. Musculoskeletal: No aggressive appearing osseous lesions are noted in the visualized portions of the skeleton. IMPRESSION: 1. The lesion of concern in the upper pole the left kidney has imaging characteristics highly concerning for renal cell carcinoma. This appears completely encapsulated within Gerota's fascia, and is separate from the left renal vein which is widely patent. No lymphadenopathy or definite signs of metastatic disease noted in the abdomen. 2. Multiple simple cysts and Bosniak class 2 proteinaceous/hemorrhagic cysts in the kidneys bilaterally, as above. 3. Small bilateral pleural effusions lying dependently. 4. Cardiomegaly. Electronically Signed   By: Vinnie Langton M.D.   On: 08/25/2019 15:19   US Renal  Result Date: 08/25/2019 CLINICAL DATA:  Diabetes mellitus, hypertension, LEFT renal mass on CT chest EXAM: RENAL / URINARY TRACT ULTRASOUND COMPLETE COMPARISON:  CT chest 08/24/2019 FINDINGS: Right Kidney: Renal measurements: 12.1 x 5.9 x 5.3 cm = volume: 199 mL. Normal cortical thickness. Increased cortical echogenicity. Multiple RIGHT renal cysts are identified. Largest cyst upper pole is 2.2 x 2.2 x 1.9 cm, simple features. Small cyst at upper pole RIGHT kidney, 1.6 x 1.6 x 1.6 cm, complicated by scattered low level  internal echoes. Largest cyst at lower pole measures 3.6 x 3.0 x 3.3 cm, simple features. No hydronephrosis or shadowing calcification. Left Kidney: Renal measurements: 12.0 x 5.8 x 5.0 cm = volume: 182 mL. Normal cortical thickness. Increased cortical echogenicity. Multiple LEFT renal cysts identified. Cyst at lateral aspect of inferior LEFT kidney 4.3 x 3.4 x 4.5 cm, simple features. Hypoechoic lesion at upper pole LEFT kidney, 3.5 x 2.2 x 2.6 cm, containing diffuse hypoechogenicity throughout, demonstrating probable internal blood flow on limited color Doppler imaging, suspicious for solid mass/neoplasm. Assessment limited by patient respirations. No hydronephrosis or shadowing calcifications. Bladder: Well distended, unremarkable. Prostatic enlargement, gland 6.7 x 5.9 x 6.6 cm (volume = 140 cm^3). IMPRESSION: Medical renal disease changes of both kidneys. BILATERAL renal cysts, including a mildly complicated cyst 1.6 cm diameter at upper pole of RIGHT kidney. Hypoechoic nodule upper pole LEFT kidney 3.5 x 2.2 x 2.6 cm, suspect containing internal blood flow on color Doppler imaging, concerning for a renal neoplasm. Further evaluation by MR imaging with and without contrast recommended to assess; if patient is not a candidate for MR, would recommend multiphase CT abdomen with and without contrast. Electronically Signed   By: Lavonia Dana M.D.   On: 08/25/2019 11:57     Management plans discussed with the patient, family and they  are in agreement.  CODE STATUS: Full Code   TOTAL TIME TAKING CARE OF THIS PATIENT: 45 minutes.    Max Sane M.D on 08/25/2019 at 6:33 PM  Between 7am to 6pm - Pager - [phone]  After 6pm go to [email or web address] - Proofreader  [facility]  Office  [phone]  CC: Primary care physician; James Body, MD   Note: This dictation was prepared with Dragon dictation along with smaller phrase technology. Any transcriptional errors  that result from this process are unintentional.

## 2019-08-25 NOTE — Progress Notes (Signed)
*  PRELIMINARY RESULTS* Echocardiogram 2D Echocardiogram has been performed.  James Oconnor 08/25/2019, 12:30 PM

## 2019-08-25 NOTE — Care Management Obs Status (Signed)
[facility] NOTIFICATION   Patient Details  Name: James Oconnor MRN: [id number] Date of Birth: January 26, 1930   Medicare Observation Status Notification Given:  Yes    Ross Ludwig, LCSW 08/25/2019, 5:00 PM

## 2019-08-25 NOTE — Care Management CC44 (Signed)
Condition Code 44 Documentation Completed  Patient Details  Name: James Oconnor MRN: [id number] Date of Birth: 26-Nov-1930   Condition Code 44 given:  Yes Patient signature on Condition Code 44 notice:  Yes Documentation of 2 MD's agreement:  Yes Code 44 added to claim:  Yes    Ross Ludwig, LCSW 08/25/2019, 5:00 PM

## 2019-08-25 NOTE — Consult Note (Signed)
[place] for heparin drip management Indication: chest pain/ACS  No Known Allergies  Patient Measurements: Height: 5\' 7"  (170.2 cm) Weight: 178 lb (80.7 kg) IBW/kg (Calculated) : 66.1  Vital Signs: Temp: 98.3 F (36.8 C) (09/24 2130) Temp Source: Oral (09/24 2130) BP: 141/87 (09/24 2130) Pulse Rate: 102 (09/24 2130)  Labs: Recent Labs    08/24/19 1331 08/24/19 1443 08/24/19 1539 08/25/19 0024  HGB 12.3*  --   --   --   HCT 37.0*  --   --   --   PLT 263  --   --   --   APTT  --  35  --   --   LABPROT  --  14.8  --   --   INR  --  1.2  --   --   HEPARINUNFRC  --   --   --  0.11*  CREATININE 1.27*  --   --   --   TROPONINIHS 105*  --  80*  --     Estimated Creatinine Clearance: 41.7 mL/min (A) (by C-G formula based on SCr of 1.27 mg/dL (H)).   Medical History: Past Medical History:  Diagnosis Date  . Diabetes mellitus without complication ([place])   . Hypertension     Assessment: 85 y.o. male with a history of diabetes, hypertension who presents to the ED for left-sided chest pain since yesterday. Patient's labs did indicate elevated troponin of 105. He is on no chronic anticoagulation PTA, Hgb 12.3, PLT wnl at baseline, all other baseline labs ordered but not yet resulted  Goal of Therapy:  Heparin level 0.3-0.7 units/ml Monitor platelets by anticoagulation protocol: Yes   Plan:  09/25 @ 0024 HL 0.11 subtherapeutic. Will rebolus w/ heparin 2500 units IV x 1 and increase rate to 1150 units/hr and will recheck HL @ 1100, CBC w/ am labs.  Tobie Lords, PharmD 08/25/2019,2:44 AM

## 2019-08-25 NOTE — Progress Notes (Signed)
Initial Nutrition Assessment  DOCUMENTATION CODES:   Non-severe (moderate) malnutrition in context of chronic illness  INTERVENTION:  -Ensure Enlive po BID, each supplement provides 350 kcal and 20 grams of protein  -Magic cup TID with meals, each supplement provides 290 kcal and 9 grams of protein  -MVI daily  NUTRITION DIAGNOSIS:   Moderate Malnutrition related to chronic illness as evidenced by energy intake < or equal to 75% for > or equal to 1 month, mild fat depletion, moderate fat depletion, mild muscle depletion, moderate muscle depletion, severe muscle depletion, per patient/family report.   GOAL:   Patient will meet greater than or equal to 90% of their needs   MONITOR:   PO intake, Supplement acceptance, Labs, Weight trends, I & O's  REASON FOR ASSESSMENT:   Malnutrition Screening Tool    ASSESSMENT:  85 year old male with known history of HTN, T2DM who presents to ED with complaints of left-sided chest pain with associated SOB and nausea over the past 3 days. CXR revealed mild cardiomegaly with trace bilateral pleural effusion, bibasilar atelectasis, and aortic atherosclerosis.  Patient admitted for evaluation of atypical chest pain/elevated high-sensitivity troponin   Patient reports ongoing poor appetite and intake over the past month. He endorses 3 meals/day, but "a few bites" at each meal. Patient along with patients wife recall oatmeal for breakfast; soup, sandwiches, or leftovers for lunch; and chicken, vegetables, rice for dinner. Patient reports drinking Boost occasionally and enjoys the chocolate flavor. Educated on small  frequent meals throughout the day verses 3 large meals to increase overall intake as well as encouraged daily nutrition supplement.   Patient reports new onset swallowing difficulties with breads over the past month; MBSS tentatively scheduled 9/29.   Patient endorses recent wt losses; recalls UBW 188 lb in January Current wt 79.1 kg  (174 lb) Weight history reviewed; noted 9.3 lb wt loss over the past 2 months. Insignificant for time frame; still concerning given advanced age.  NUTRITION - FOCUSED PHYSICAL EXAM:    Most Recent Value  Orbital Region  Moderate depletion  Upper Arm Region  Moderate depletion  Thoracic and Lumbar Region  Unable to assess  Buccal Region  Mild depletion  Temple Region  Moderate depletion  Clavicle Bone Region  Mild depletion  Clavicle and Acromion Bone Region  Mild depletion  Scapular Bone Region  Unable to assess  Dorsal Hand  Moderate depletion  Patellar Region  Severe depletion  Anterior Thigh Region  Unable to assess  Posterior Calf Region  Severe depletion  Edema (RD Assessment)  None  Hair  Reviewed  Eyes  Reviewed  Mouth  Reviewed  Skin  Reviewed  Nails  Reviewed       Diet Order:   Diet Order            Diet Heart Room service appropriate? Yes; Fluid consistency: Thin  Diet effective now              EDUCATION NEEDS:   Education needs have been addressed  Skin:  Skin Assessment: Reviewed RN Assessment  Last BM:  9/23  Height:   Ht Readings from Last 1 Encounters:  08/24/19 5\' 7"  (1.702 m)    Weight:   Wt Readings from Last 1 Encounters:  08/25/19 79.1 kg    Ideal Body Weight:  67.3 kg  BMI:  Body mass index is 27.31 kg/m.  Estimated Nutritional Needs:   Kcal:  1800-2100  Protein:  87-101  Fluid:  >/=1.8 L/day  Lajuan Lines, [place], [place] Office [phone] After Hours/Weekend Pager: [phone]

## 2019-08-28 ENCOUNTER — Encounter: Payer: Self-pay | Admitting: Physical Therapy

## 2019-08-28 DIAGNOSIS — M6281 Muscle weakness (generalized): Secondary | ICD-10-CM

## 2019-08-28 DIAGNOSIS — M533 Sacrococcygeal disorders, not elsewhere classified: Secondary | ICD-10-CM

## 2019-08-28 DIAGNOSIS — R278 Other lack of coordination: Secondary | ICD-10-CM

## 2019-08-28 NOTE — Progress Notes (Signed)
12/09/2018 2:52 PM   James Oconnor [birth year] [id number]  Referring provider: Dion Body, MD [facility] [place],  [place] [address]  Chief Complaint  Patient presents with  . Benign Prostatic Hypertrophy   HPI: James Oconnor is a 85 y.o. male who is DNR with HTN and DM with a history of elevated PSA, BPH with LU TS, urge incontinence, nocturia and now RCC who presents for follow up.    History of elevated PSA PSA Trend  3.30 in November 2016  3.24 in February 2018    Component     Latest Ref Rng & Units 09/07/2018 12/07/2018 06/07/2019  Prostate Specific Ag, Serum     0.0 - 4.0 ng/mL 7.2 (H) 2.7 2.8  Started on finasteride at 09/07/2018 visit   MRI prostate on 11/08/2018 was revealed A PI-RADS category 4 lesion of the right posterolateral peripheral zone at the apex is the only lesion of concern identified. Appropriate targeting data sent to [place].  Benign prostatic hypertrophy with marked prostatomegaly, prostate volume 155.77 cubic cm.  Trabeculation of the bladder wall with small bladder diverticula raising the possibility of some degree of chronic outlet obstruction.  Patient deferred pursuing a biopsy at this time.  Repeated PSA 2.8 on 05/2019.  PSAD 0.041.    BPH WITH LUTS  (prostate and/or bladder) IPSS score: 27/4    PVR: 297   Previous score: 32/6  Previous PVR: 202 mL   Major complaint(s):  Frequency, urgency, nocturia, incontinence, intermittency and a weak urinary stream  Denies any dysuria, hematuria or suprapubic pain.   Currently taking: No medication.  He discontinued the finasteride due to fear of a decrease in his libido.  He stopped the Cialis for unknown reasons  Denies any recent fevers, chills, nausea or vomiting.  He does not have a family history of PCa.  IPSS    Row Name 08/29/19 1400         International Prostate Symptom Score   How often have you had the sensation of not emptying your bladder?  About half the  time     How often have you had to urinate less than every two hours?  More than half the time     How often have you found you stopped and started again several times when you urinated?  More than half the time     How often have you found it difficult to postpone urination?  More than half the time     How often have you had a weak urinary stream?  Almost always     How often have you had to strain to start urination?  About half the time     How many times did you typically get up at night to urinate?  4 Times     Total IPSS Score  27       Quality of Life due to urinary symptoms   If you were to spend the rest of your life with your urinary condition just the way it is now how would you feel about that?  Mostly Disatisfied        Score:  1-7 Mild 8-19 Moderate 20-35 Severe   RCC Found incidentally during recent hospitalization:  MRI on 08/25/2019 revealed in the anterior aspect of the upper pole the left kidney there is a 2.6 x 2.2 x 3.1 cm lesion that is heterogeneous in signal intensity on T1 weighted images (T1 isointense to T1 hyperintense), isointense  on T2 weighted images, with avid arterial phase enhancement and some delayed washout, compatible with a solid renal neoplasm, likely a renal cell carcinoma. This appears encapsulated within Gerota's fascia, and is well separated from the left renal vein, which is widely patent.  He is not having flank pain or gross hematuria.   PMH: Past Medical History:  Diagnosis Date  . Diabetes mellitus without complication ([place])   . Hypertension     Surgical History: None  Home Medications:  Allergies as of 08/29/2019   No Known Allergies     Medication List       Accurate as of August 29, 2019  2:52 PM. If you have any questions, ask your nurse or doctor.        doxycycline 100 MG tablet Commonly known as: VIBRA-TABS Take 1 tablet (100 mg total) by mouth every 12 (twelve) hours.   finasteride 5 MG tablet Commonly known  as: PROSCAR Take 1 tablet (5 mg total) by mouth daily. Started by: Zara Council, PA-C   lisinopril 10 MG tablet Commonly known as: ZESTRIL Take 10 mg by mouth daily.   tadalafil 5 MG tablet Commonly known as: CIALIS Take 1 tablet (5 mg total) by mouth as needed.       Allergies: No Known Allergies  Family History: Family History  Problem Relation Age of Onset  . Lung cancer Father   . Heart disease Sister   . Lung cancer Brother     Social History:  reports that he has quit smoking. His smoking use included cigarettes. He quit after 60.00 years of use. He has never used smokeless tobacco. No history on file for alcohol and drug.  ROS: UROLOGY Frequent Urination?: Yes Hard to postpone urination?: Yes Burning/pain with urination?: No Get up at night to urinate?: Yes Leakage of urine?: Yes Urine stream starts and stops?: Yes Trouble starting stream?: No Do you have to strain to urinate?: No Blood in urine?: No Urinary tract infection?: No Sexually transmitted disease?: No Injury to kidneys or bladder?: No Painful intercourse?: No Weak stream?: Yes Erection problems?: No Penile pain?: No  Gastrointestinal Nausea?: No Vomiting?: No Indigestion/heartburn?: No Diarrhea?: No Constipation?: No  Constitutional Fever: No Night sweats?: No Weight loss?: Yes Fatigue?: No  Skin Skin rash/lesions?: No Itching?: No  Eyes Blurred vision?: No Double vision?: No  Ears/Nose/Throat Sore throat?: No Sinus problems?: No  Hematologic/Lymphatic Swollen glands?: No Easy bruising?: No  Cardiovascular Leg swelling?: No Chest pain?: No  Respiratory Cough?: Yes Shortness of breath?: Yes  Endocrine Excessive thirst?: No  Musculoskeletal Back pain?: Yes Joint pain?: No  Neurological Headaches?: No Dizziness?: No  Psychologic Depression?: No Anxiety?: No  Physical Exam: BP 104/66 (BP Location: Left Arm, Patient Position: Sitting, Cuff Size: Normal)    Pulse 96   Ht 5\' 7"  (1.702 m)   Wt 171 lb 12.8 oz (77.9 kg)   BMI 26.91 kg/m   Constitutional:  Well nourished. Alert and oriented, No acute distress. HEENT:  AT, moist mucus membranes.  Trachea midline, no masses. Cardiovascular: No clubbing, cyanosis, or edema. Respiratory: Normal respiratory effort, no increased work of breathing. Neurologic: Grossly intact, no focal deficits, moving all 4 extremities. Psychiatric: Normal mood and affect.  Laboratory Data: See HPI I have reviewed the labs.  Pertinent Imaging: CLINICAL DATA:  85 year old male with history of renal mass. Follow-up evaluation.  EXAM: MRI ABDOMEN WITHOUT AND WITH CONTRAST  TECHNIQUE: Multiplanar multisequence MR imaging of the abdomen was performed both before and  after the administration of intravenous contrast.  CONTRAST:  16mL GADAVIST GADOBUTROL 1 MMOL/ML IV SOLN  COMPARISON:  No prior abdominal MRI.  Renal ultrasound 08/25/2019.  FINDINGS: Comment: Today's study is limited by extensive patient respiratory motion.  Lower chest: Small bilateral pleural effusions lying dependently with signal intensity in the visualize lung bases which likely reflects associated passive atelectasis. Cardiomegaly.  Hepatobiliary: In the central aspect of the liver, there is a 8.0 x 7.5 x 6.8 cm T1 hypointense, T2 hyperintense, nonenhancing lesion, compatible with a large simple cyst. No other suspicious hepatic lesions. No intra or extrahepatic biliary ductal dilatation. Gallbladder is normal in appearance.  Pancreas: No pancreatic mass. No pancreatic ductal dilatation. No pancreatic or peripancreatic fluid collections or inflammatory changes.  Spleen:  Unremarkable.  Adrenals/Urinary Tract: In the anterior aspect of the upper pole the left kidney there is a 2.6 x 2.2 x 3.1 cm lesion that is heterogeneous in signal intensity on T1 weighted images (T1 isointense to T1 hyperintense), isointense on T2  weighted images, with avid arterial phase enhancement and some delayed washout, compatible with a solid renal neoplasm, likely a renal cell carcinoma. This appears encapsulated within Gerota's fascia, and is well separated from the left renal vein, which is widely patent. Multiple other T1 hypointense, T2 hyperintense, nonenhancing lesions are noted in both kidneys, compatible with simple cysts, largest of which is in the posterior aspect of the lower pole the left kidney, compatible with simple cysts. In the posterior aspect of the interpolar region of the right kidney there is an 8 mm T1 hyperintense, T2 hypointense, nonenhancing lesion, compatible with a small proteinaceous/hemorrhagic cyst (axial image 51 of series 10). No hydroureteronephrosis in the visualized portions of the abdomen. Mild thickening of the adrenal glands bilaterally, likely reflective of mild adrenal hyperplasia.  Stomach/Bowel: Visualized portions are unremarkable.  Vascular/Lymphatic: Aortic atherosclerosis, without evidence of aneurysm in the abdominal vasculature. No lymphadenopathy noted in the abdomen.  Other: No significant volume of ascites noted in the visualized portions of the peritoneal cavity.  Musculoskeletal: No aggressive appearing osseous lesions are noted in the visualized portions of the skeleton.  IMPRESSION: 1. The lesion of concern in the upper pole the left kidney has imaging characteristics highly concerning for renal cell carcinoma. This appears completely encapsulated within Gerota's fascia, and is separate from the left renal vein which is widely patent. No lymphadenopathy or definite signs of metastatic disease noted in the abdomen. 2. Multiple simple cysts and Bosniak class 2 proteinaceous/hemorrhagic cysts in the kidneys bilaterally, as above. 3. Small bilateral pleural effusions lying dependently. 4. Cardiomegaly.   Electronically Signed   By: Vinnie Langton  M.D.   On: 08/25/2019 15:19 I have independently reviewed the films with Dr. Erlene Quan and appreciate the renal mass.   Assessment & Plan:    1. RCC Discussed with patient that approximately 20% are benign, 60% appear to be relatively indolent renal cell carcinoma and 20% demonstrated potentially aggressive histological features. Management options consist of a radical nephrectomy, partial nephrectomy or active surveillance - due to his advanced age we have decided to pursue active surveillance at this time and he will return in 6 months for a repeated MRI  2. History of elevated PSA MRI of prostate notes PI-RADS 4 lesion, but patient has deferred biopsy due to his advanced age 3 recent PSA 2.8 in 05/2019  3. BPH with LUTS IPSS score is 32/6 Continue conservative management, avoiding bladder irritants and timed voiding's Will restart the Cialis 5  mg daily and finasteride 5 mg daily RTC in 6 months for I PSS, PSA and PVR - he will contact us sooner ( in 3 months) if he is not seeing improvement in his urinary symptoms   4. Weight loss Patient is concerned about the 20 lbs weight loss he has experienced since January.  He has been having issues with swallowing and has reduced his food intake due to this and has been referred to a speech therapist for further evaluation.  I am doubtful that the possible RCC or possible prostate cancer would be the cause of his weight loss as no metastasis have been seen.     Return in about 6 months (around 02/26/2020) for MRI of kidneys, I PSS and PVR .  Zara Council, PA-C  [facility] [address]  [place],  [address] [phone]

## 2019-08-28 NOTE — Therapy (Signed)
[place] [facility] SERVICES [address] [place], [place], [address] Phone: [phone]   Fax:  [phone]  Patient Details  Name: James Oconnor MRN: [id number] Date of Birth: [birth year] Referring Provider: Zara Council   Discharge Summary   Discharging pt at this time due to medical work up and waiting for urologist to communicate when pt is appropriate to resume PT.   Received message from dtr re: pt's hospitalization.  Medical chart showed ED visit with hospitalization on 08/24/19 for L sided chest pain.  Note by hospitalist "MRI abd is worrisome for renal mass/carcinoma. Case d/w Dr Alyson Ingles who recommends outpt f/up with Dr Cherrie Gauze office.".   Forwarded pt's last PT visit/ progress note 08/10/19 to urologist today.      PT status based on last visit:   Physical Therapy Treatment / Progress Note 7 Visit from 06/15/19 - 08/10/19   Subjective Assessment - 08/10/19 1131    Subjective  Pt reports no pain last week at the glut nor hamstrings. Pt states he is getting at night 3-4 x which has not improved. Pt also is not emptying all the way which causes an increase in frequency.    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 Recent orthopedic Hx:  Pt pulled his R hamstring in Jan 2020.  He also had a fall in 2013 after falling off a ladder and landing R buttock. Xray showed sciatic nerve problem. Pt is undergoing Physical Therapy and will be d/c next Wednesday because he is 100% better. Denied surgeries , injuries except for the fall in Jan 2013    Patient Stated Goals  reduce the urgency to urinate             [facility] PT Assessment - 08/10/19 1202            Observation/Other Assessments   Observations  golfing: supination on outer leg with minor LOB, ankle instability at end of swing.                     Pelvic Floor Special Questions - 08/10/19 1557    External Perineal Exam   pelvic floor tightness at bulbospongiousus, ischiocavernosus, deep transverse perineal R ( decreased post Tx)            [facility] Adult PT Treatment/Exercise - 08/10/19 1158            Therapeutic Activites    Therapeutic Activities  --   simulated toileting posture: breathing technique        Neuro Re-ed    Neuro Re-ed Details   cued for proper lengthening  of pelvic floor and pelvic tilts to improve complete urination.  Tactile and verbal cues for more transverse arch co-activation with golf swing to minimize supination and LOB, cued for less ab overuse with deep core coordination / pelvic floor            Manual Therapy   Manual therapy comments  STM/MWM at R bulbospongiosus/ deep transverse perineal                        PT Long Term Goals - 07/27/19 2354            PT LONG TERM GOAL #1   Title  "Pt will decrease  scores on NIH-CPSI from   44% to <22    % in order to restore pelvic floor functions  Time  10    Period  Weeks    Status  On-going        PT LONG TERM GOAL #2   Title  Pt will demo less flexed coccyx across 2 weeks in order to restore nutation of sacrum and longer stride length in gait    Time  4    Period  Weeks    Status  Achieved        PT LONG TERM GOAL #3   Title  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 in order to participate in functional tasks    Time  8    Period  Weeks    Status  Achieved        PT LONG TERM GOAL #4   Title  Pt will demo proper deep core coordination with proper diaphragmatic excursion in order to optimize postural stability and pelvic floor function    Time  2    Period  Weeks    Status  Achieved        PT LONG TERM GOAL #5   Title  Pt will demo increased strength at hip ext B from 3+/5 to >4+/5     in order to progress to fitness activities with less risk for injuries    Time  8    Period  Weeks    Status  On-going        Additional Long  Term Goals   Additional Long Term Goals  Yes        PT LONG TERM GOAL #6   Title  Pt will be referred to sleep study to screen for OSA to see if nocturia is associated with OSA to improve quality of sleep    Time  4    Period  Weeks    Status  New    Target Date  08/24/19                Plan - 08/10/19 1556    Clinical Impression Statement  Pt has achieved 3/6 goals and progressing well towards remaining goals.  Pt's scoliosis and leg length difference has been addressed with shoe lift, manual Tx, and specific HEP which has rendered more equal alignment of pelvic girdle and significantly decreased pelvic sway and trunk lean. Pt's R glut pain has resolved which was likely associated with leg length difference. Pt's R pelvic floor tightness is also likely due to leg length difference 2/2 scoliosis. External manual Tx has decreased pelvic floor tightness and pt demo'd proper deep core coordination to facilitate proper relaxing of pelvic floor mm. Anticipate this progression will yield improvements with urinary frequency and better emptying of urine. Provided training to improve better technique with golf to co-activate deep core and intrinsic feet muscles to minimize risk of injuries. Anticipate pt is ready to return to his hobby.  Addressing his nocturia of 3-4 x with a referral to PCP for sleep study as pt has risk factors for OSA. Pt consented to undergo sleep study per MD order.  PCP has been notified re: sleep study referral.   Pt continues to benefit from skilled PT with further focus on pelvic floor issues as orthopedic issues have improved significantly and thus, will render better outcomes for pelvic floor issues.    Nocturia and OSA Citations: Raheem O et al. Clinical predictors of nocturia in the sleep apnea population. Urology Annals. 2014. 6 (1): 31-35.).    Kalman Drape al. Sleep  Apnea and Cardiovascular Disease. Journal of the [employer] of  Cardiology. 2008. 52(8).   Chang A et al. Management of Nocturia in the Male. Current Urology Reports. 2015. 16.(3) :485.            Personal Factors and Comorbidities  Age;Fitness    Examination-Activity Limitations  Loss adjuster, chartered;Shop    Stability/Clinical Decision Making  Stable/Uncomplicated    Rehab Potential  Good    PT Frequency  1x / week    PT Duration  --   10   PT Treatment/Interventions  Neuromuscular re-education;Moist Heat;Therapeutic activities;Functional mobility training;Therapeutic exercise;Patient/family education;Manual techniques;Manual lymph drainage;Taping;Balance training;Gait training    Consulted and Agree with Plan of Care  Patient       Patient will benefit from skilled therapeutic intervention in order to improve the following deficits and impairments:  Decreased coordination, Decreased range of motion, Decreased endurance, Decreased balance, Decreased mobility, Decreased strength, Postural dysfunction, Improper body mechanics, Decreased safety awareness, Increased muscle spasms, Hypomobility  Visit Diagnosis: Sacrococcygeal disorders, not elsewhere classified  Muscle weakness (generalized)  Other lack of coordination           Encounter Date: 08/28/2019   Jerl Mina ,PT, DPT, E-RYT  08/28/2019, 11:35 AM  [facility] [address] [place], [place], [address] Phone: [phone]   Fax:  [phone]

## 2019-08-29 ENCOUNTER — Other Ambulatory Visit: Payer: Self-pay

## 2019-08-29 ENCOUNTER — Encounter: Payer: Self-pay | Admitting: Urology

## 2019-08-29 ENCOUNTER — Ambulatory Visit: Payer: Medicare HMO

## 2019-08-29 ENCOUNTER — Ambulatory Visit (INDEPENDENT_AMBULATORY_CARE_PROVIDER_SITE_OTHER): Payer: Medicare HMO | Admitting: Urology

## 2019-08-29 VITALS — BP 104/66 | HR 96 | Ht 67.0 in | Wt 171.8 lb

## 2019-08-29 DIAGNOSIS — C642 Malignant neoplasm of left kidney, except renal pelvis: Secondary | ICD-10-CM | POA: Diagnosis not present

## 2019-08-29 DIAGNOSIS — N138 Other obstructive and reflux uropathy: Secondary | ICD-10-CM | POA: Diagnosis not present

## 2019-08-29 DIAGNOSIS — N401 Enlarged prostate with lower urinary tract symptoms: Secondary | ICD-10-CM | POA: Diagnosis not present

## 2019-08-29 DIAGNOSIS — R972 Elevated prostate specific antigen [PSA]: Secondary | ICD-10-CM

## 2019-08-29 LAB — BLADDER SCAN AMB NON-IMAGING: Scan Result: 297

## 2019-08-29 MED ORDER — TADALAFIL 5 MG PO TABS: 5.0000 mg | ORAL_TABLET | ORAL | 11 refills | Status: DC | PRN

## 2019-08-29 MED ORDER — FINASTERIDE 5 MG PO TABS: 5.0000 mg | ORAL_TABLET | Freq: Every day | ORAL | 11 refills | Status: DC

## 2019-09-01 ENCOUNTER — Ambulatory Visit: Payer: Medicare HMO | Admitting: Physical Therapy

## 2019-09-01 ENCOUNTER — Ambulatory Visit
Admission: RE | Admit: 2019-09-01 | Discharge: 2019-09-01 | Disposition: A | Discharge status: Home / Self Care | Payer: Medicare HMO | Source: Ambulatory Visit | Attending: Family Medicine | Admitting: Family Medicine

## 2019-09-01 ENCOUNTER — Other Ambulatory Visit: Payer: Self-pay

## 2019-09-01 DIAGNOSIS — R1310 Dysphagia, unspecified: Secondary | ICD-10-CM | POA: Diagnosis not present

## 2019-09-01 NOTE — Therapy (Signed)
[employer] [place], [place], [address] Phone: [phone]   Fax:     Modified Barium Swallow  Patient Details  Name: James Oconnor MRN: [id number] Date of Birth: 01/22/1930 No data recorded  Encounter Date: 09/01/2019  End of Session - 09/01/19 1508    Visit Number  1    Number of Visits  1    Date for SLP Re-Evaluation  09/01/19    SLP Start Time  0820    SLP Stop Time   0920    SLP Time Calculation (min)  60 min    Activity Tolerance  Patient tolerated treatment well       Past Medical History:  Diagnosis Date  . Diabetes mellitus without complication ([place])   . Hypertension     Past Surgical History:  Procedure Laterality Date  . none      There were no vitals filed for this visit.        Subjective: Patient behavior: (alertness, ability to follow instructions, etc.): pt denied any Pulmonary or Neurological deficits/dxs; no recent pneumonia in 6+ months. No change in diet but some weight loss since first of year. He eats a regular diet w/ thin liquids; Boost supplement.  Chief complaint: dysphagia. Pt reported c/o Esophageal phase dysmotility and discomfort when eating certain Solid Foods such as breads, meats. He c/o a "tightness" pointing to his lower sternum area and c/o increased phlegm. Noted mild Belching during/post trials. Pt stated he does not take a PPI; no dx of Reflux in past.  OM exam: WFL. Dentition: dental implants.   Objective:  Radiological Procedure: A videoflouroscopic evaluation of oral-preparatory, reflex initiation, and pharyngeal phases of the swallow was performed; as well as a screening of the upper esophageal phase.  I. POSTURE: upright II. VIEW: lateral III. COMPENSATORY STRATEGIES: TIME b/t trials; small bites/sips IV. BOLUSES ADMINISTERED:  Thin Liquid: 5 trials  Nectar-thick Liquid: 1 trial  Honey-thick Liquid: NT  Puree: 3 trials  Mechanical Soft: 2  trials V. RESULTS OF EVALUATION: A. ORAL PREPARATORY PHASE: (The lips, tongue, and velum are observed for strength and coordination)       **Overall Severity Rating: grossly WFL. Noted adequate bolus management w/ min bolus piecemealing - full oral clearing achieved w/ all trials given time for f/u swallow.  B. SWALLOW INITIATION/REFLEX: (The reflex is normal if "triggered" by the time the bolus reached the base of the tongue)  **Overall Severity Rating: grossly WFL. Timing of the pharyngeal swallow initiation was grossly [facility] for all consistencies for pt's age. Pharyngeal swallow initiation occurred w/ thin liquids at/spilling from the Valleculae to the Pyriform Sinuses; Nectar liquids, purees, and soft solids triggered the pharyngeal swallow at the level of the Valleculae. Tight, timely airway closure noted during the swallows. NO aspiration noted. Trace-slight penetration noted w/ 1 bolus trial given during the study(thin liquids) which appeared to clear w/ completion of swallow --- NO further episodes of laryngeal penetration noted w/ thin liquids.  C. PHARYNGEAL PHASE: (Pharyngeal function is normal if the bolus shows rapid, smooth, and continuous transit through the pharynx and there is no pharyngeal residue after the swallow)  **Overall Severity Rating: [facility]. No significant pharyngeal residue remained post swallows indicating adequate laryngeal excursion and pharyngeal pressure during the swallow.  D. LARYNGEAL PENETRATION: (Material entering into the laryngeal inlet/vestibule but not aspirated): trace-slight laryngeal penetration noted w/ thin liquids x1 w/ apparent clearing w/ completion of the swallow - this did not  reoccur w/ following thin liquid trials during remainder of study  E. ASPIRATION: NONE F. ESOPHAGEAL PHASE: (Screening of the upper esophagus): adequate Cervical Esophageal motility and clearing screened   ASSESSMENT: Pt appears to present w/ grossly adequate oropharyngeal phase  swallowing function w/ No significant oropharyngeal phase dysphagia noted during this evaluation today ---- NO aspiration of foods/liquids noted during the study. During the Oral phase, pt exhibited adequate bolus management and control of trials w/ min bolus piecemealing noted w/ all consistencies - full oral clearing achieved w/ all trials given time for f/u swallow and clearing. Allowing pt to take his Time and follow through w/ swallowing at his own Pace/Timing appeared to Best Support pt during oral intake; also the use of Small bites/sips for best control. Pt stated he did so at home and chewed his foods "well" b/f swallowing. During the Pharyngeal phase, timing of the pharyngeal swallow initiation was grossly [facility] for all consistencies for pt's age. Pharyngeal swallow initiation occurred w/ thin liquids at/spilling from the Valleculae to the Pyriform Sinuses; Nectar liquids, purees, and soft solids triggered the pharyngeal swallow at the level of the Valleculae. Tight, timely airway closure noted during the swallows. NO aspiration noted. Trace-slight penetration noted w/ 1 bolus trial given during the study(thin liquids) which appeared to clear w/ completion of swallow --- NO further episodes of laryngeal penetration noted w/ thin liquids. Pt was distracted and talking. This type of presentation can be considered WFL in the aging swallow. No significant pharyngeal residue remained post swallows indicating adequate laryngeal excursion and pharyngeal pressure during the swallow. Any slight, diffuse residue remaining (mostly min oral residue) was cleared w/ a spontaneous, f/u Dry swallow (no cues needed). During the Esophageal phase and screening of the Cervical Esophagus revealed adequate motility and clearing, HOWEVER, DISTAL Esophageal dysmotility was pt's complaint. Any presentation of Esophageal dysmotility can impact overall oral intake and might be related to pt's c/o discomfort w/ solid foods at  meals.   PLAN/RECOMMENDATIONS:  A. Diet: mech soft diet (meats/foods cut Small, Moistened); thin liquids. Pills in Puree if easier for swallowing and Esophageal clearing  B. Swallowing Precautions: general aspiration precautions; REFLUX precautions  C. Recommended consultation to: GI for assessment and management of Esophageal Dysmotility; possible Reflux issues (secondary to pt's complaints)  D. Therapy recommendations: NONE  E. Results and recommendations were discussed w/ pt; video viewed and questions answered; also discussed above w/ Wife after study          Dysphagia, unspecified type - Plan: DG SWALLOW FUNC OP MEDICARE SPEECH PATH, DG SWALLOW FUNC OP MEDICARE SPEECH PATH        Problem List Patient Active Problem List   Diagnosis Date Noted  . Chest pain 08/24/2019  . Other male erectile dysfunction 05/31/2019  . History of normocytic normochromic anemia 01/11/2019  . DNR (do not resuscitate) 01/07/2017  . Essential hypertension 05/10/2014  . BPH (benign prostatic hyperplasia) 11/11/2012          Orinda Kenner, MS, CCC-SLP Conrad Zajkowski 09/01/2019, 3:09 PM  [facility] [place], [place], [address] Phone: [phone]   Fax:     Name: Snapper Decelles MRN: [id number] Date of Birth: [birth year]

## 2019-09-04 ENCOUNTER — Other Ambulatory Visit: Payer: Self-pay | Admitting: Gastroenterology

## 2019-09-04 ENCOUNTER — Telehealth: Payer: Self-pay | Admitting: Family Medicine

## 2019-09-04 DIAGNOSIS — R1310 Dysphagia, unspecified: Secondary | ICD-10-CM

## 2019-09-04 NOTE — Telephone Encounter (Signed)
Cialis 5mg  has been approved. 08/01/2019-08/30/2020

## 2019-09-07 ENCOUNTER — Other Ambulatory Visit: Payer: Self-pay | Admitting: Gastroenterology

## 2019-09-07 ENCOUNTER — Ambulatory Visit
Admission: RE | Admit: 2019-09-07 | Discharge: 2019-09-07 | Disposition: A | Discharge status: Home / Self Care | Payer: Medicare HMO | Source: Ambulatory Visit | Attending: Gastroenterology | Admitting: Gastroenterology

## 2019-09-07 ENCOUNTER — Other Ambulatory Visit: Payer: Self-pay

## 2019-09-07 ENCOUNTER — Encounter: Payer: Medicare HMO | Admitting: Physical Therapy

## 2019-09-07 DIAGNOSIS — R1310 Dysphagia, unspecified: Secondary | ICD-10-CM | POA: Insufficient documentation

## 2019-09-11 ENCOUNTER — Emergency Department: Payer: Medicare HMO

## 2019-09-11 ENCOUNTER — Emergency Department
Admission: EM | Admit: 2019-09-11 | Discharge: 2019-09-11 | Disposition: A | Discharge status: Home / Self Care | Payer: Medicare HMO | Attending: Student in an Organized Health Care Education/Training Program | Admitting: Student in an Organized Health Care Education/Training Program

## 2019-09-11 ENCOUNTER — Ambulatory Visit
Admission: RE | Admit: 2019-09-11 | Discharge: 2019-09-11 | Disposition: A | Discharge status: Home / Self Care | Payer: Medicare HMO | Source: Ambulatory Visit | Attending: Gastroenterology | Admitting: Gastroenterology

## 2019-09-11 ENCOUNTER — Other Ambulatory Visit: Payer: Self-pay

## 2019-09-11 ENCOUNTER — Other Ambulatory Visit: Payer: Self-pay | Admitting: Gastroenterology

## 2019-09-11 ENCOUNTER — Encounter: Payer: Self-pay | Admitting: Emergency Medicine

## 2019-09-11 DIAGNOSIS — N2889 Other specified disorders of kidney and ureter: Secondary | ICD-10-CM | POA: Diagnosis not present

## 2019-09-11 DIAGNOSIS — K5900 Constipation, unspecified: Secondary | ICD-10-CM

## 2019-09-11 DIAGNOSIS — Z79899 Other long term (current) drug therapy: Secondary | ICD-10-CM | POA: Insufficient documentation

## 2019-09-11 DIAGNOSIS — E119 Type 2 diabetes mellitus without complications: Secondary | ICD-10-CM | POA: Diagnosis not present

## 2019-09-11 DIAGNOSIS — R1031 Right lower quadrant pain: Secondary | ICD-10-CM | POA: Insufficient documentation

## 2019-09-11 DIAGNOSIS — R1032 Left lower quadrant pain: Secondary | ICD-10-CM | POA: Insufficient documentation

## 2019-09-11 DIAGNOSIS — I10 Essential (primary) hypertension: Secondary | ICD-10-CM | POA: Diagnosis not present

## 2019-09-11 DIAGNOSIS — R339 Retention of urine, unspecified: Secondary | ICD-10-CM

## 2019-09-11 DIAGNOSIS — R0989 Other specified symptoms and signs involving the circulatory and respiratory systems: Secondary | ICD-10-CM

## 2019-09-11 DIAGNOSIS — R933 Abnormal findings on diagnostic imaging of other parts of digestive tract: Secondary | ICD-10-CM

## 2019-09-11 DIAGNOSIS — Z87891 Personal history of nicotine dependence: Secondary | ICD-10-CM | POA: Insufficient documentation

## 2019-09-11 DIAGNOSIS — N1330 Unspecified hydronephrosis: Secondary | ICD-10-CM | POA: Diagnosis not present

## 2019-09-11 LAB — URINALYSIS, COMPLETE (UACMP) WITH MICROSCOPIC
Bacteria, UA: NONE SEEN
Bilirubin Urine: NEGATIVE
Glucose, UA: NEGATIVE mg/dL
Ketones, ur: NEGATIVE mg/dL
Leukocytes,Ua: NEGATIVE
Nitrite: NEGATIVE
Protein, ur: NEGATIVE mg/dL
Specific Gravity, Urine: 1.004 — ABNORMAL LOW (ref 1.005–1.030)
pH: 6 (ref 5.0–8.0)

## 2019-09-11 LAB — CBC WITH DIFFERENTIAL/PLATELET
Abs Immature Granulocytes: 0.03 10*3/uL (ref 0.00–0.07)
Basophils Absolute: 0 10*3/uL (ref 0.0–0.1)
Basophils Relative: 0 %
Eosinophils Absolute: 0 10*3/uL (ref 0.0–0.5)
Eosinophils Relative: 0 %
HCT: 36.8 % — ABNORMAL LOW (ref 39.0–52.0)
Hemoglobin: 12.1 g/dL — ABNORMAL LOW (ref 13.0–17.0)
Immature Granulocytes: 0 %
Lymphocytes Relative: 14 %
Lymphs Abs: 1.5 10*3/uL (ref 0.7–4.0)
MCH: 26.4 pg (ref 26.0–34.0)
MCHC: 32.9 g/dL (ref 30.0–36.0)
MCV: 80.2 fL (ref 80.0–100.0)
Monocytes Absolute: 0.6 10*3/uL (ref 0.1–1.0)
Monocytes Relative: 6 %
Neutro Abs: 8.2 10*3/uL — ABNORMAL HIGH (ref 1.7–7.7)
Neutrophils Relative %: 80 %
Platelets: 412 10*3/uL — ABNORMAL HIGH (ref 150–400)
RBC: 4.59 MIL/uL (ref 4.22–5.81)
RDW: 13.4 % (ref 11.5–15.5)
WBC: 10.4 10*3/uL (ref 4.0–10.5)
nRBC: 0 % (ref 0.0–0.2)

## 2019-09-11 LAB — BASIC METABOLIC PANEL
Anion gap: 11 (ref 5–15)
BUN: 24 mg/dL — ABNORMAL HIGH (ref 8–23)
CO2: 24 mmol/L (ref 22–32)
Calcium: 9 mg/dL (ref 8.9–10.3)
Chloride: 97 mmol/L — ABNORMAL LOW (ref 98–111)
Creatinine, Ser: 1.79 mg/dL — ABNORMAL HIGH (ref 0.61–1.24)
GFR calc Af Amer: 39 mL/min — ABNORMAL LOW (ref >60)
GFR calc non Af Amer: 33 mL/min — ABNORMAL LOW (ref >60)
Glucose, Bld: 120 mg/dL — ABNORMAL HIGH (ref 70–99)
Potassium: 4.3 mmol/L (ref 3.5–5.1)
Sodium: 132 mmol/L — ABNORMAL LOW (ref 135–145)

## 2019-09-11 MED ORDER — SODIUM CHLORIDE 0.9 % IV BOLUS
500.0000 mL | Freq: Once | INTRAVENOUS | Status: AC
  Administered 2019-09-11: 500 mL via INTRAVENOUS

## 2019-09-11 MED ORDER — LIDOCAINE HCL URETHRAL/MUCOSAL 2 % EX GEL
1.0000 "application " | Freq: Once | CUTANEOUS | Status: AC
  Administered 2019-09-11: 1 via URETHRAL
  Filled 2019-09-11: qty 10

## 2019-09-11 MED ORDER — POLYETHYLENE GLYCOL 3350 17 G PO PACK: 17.0000 g | PACK | Freq: Every day | ORAL | 0 refills | Status: DC

## 2019-09-11 MED ORDER — IOHEXOL 300 MG/ML  SOLN
75.0000 mL | Freq: Once | INTRAMUSCULAR | Status: AC | PRN
  Administered 2019-09-11: 75 mL via INTRAVENOUS

## 2019-09-11 MED ORDER — IOHEXOL 350 MG/ML SOLN: 75.0000 mL | Freq: Once | INTRAVENOUS | Status: DC | PRN

## 2019-09-11 NOTE — ED Triage Notes (Signed)
First Nurse Note: Arrives with family-- sent to ED for evaluation of ileus and urinary retention.  Patient AAOx3.  Skin warm and dry. NAD

## 2019-09-11 NOTE — ED Provider Notes (Addendum)
[facility] Emergency Department Provider Note    First MD Initiated Contact with Patient 09/11/19 1624     (approximate)  I have reviewed the triage vital signs and the nursing notes.   HISTORY  Chief Complaint Constipation and Urinary Retention    HPI James Oconnor is a 85 y.o. male below listed past medical history presents the ER for for chief complaint of being unable to eat move his bowel or feel like he is able to empty his bladder past 2 3 days.  Has also not follow his been able to pass gas in 2 days.  Does have generalized nausea but no abdominal pain.  Feels that this is related to a barium swallow study that he had recently.  Denies any fevers.  No previous abdominal surgeries.  Denies any chest pain or shortness of breath.  No fevers.  Had outpatient radiography today showing possible ileus.    Past Medical History:  Diagnosis Date  . Diabetes mellitus without complication ([place])   . Hypertension    Family History  Problem Relation Age of Onset  . Lung cancer Father   . Heart disease Sister   . Lung cancer Brother    Past Surgical History:  Procedure Laterality Date  . none     Patient Active Problem List   Diagnosis Date Noted  . Chest pain 08/24/2019  . Other male erectile dysfunction 05/31/2019  . History of normocytic normochromic anemia 01/11/2019  . DNR (do not resuscitate) 01/07/2017  . Essential hypertension 05/10/2014  . BPH (benign prostatic hyperplasia) 11/11/2012      Prior to Admission medications   Medication Sig Start Date End Date Taking? Authorizing Provider  finasteride (PROSCAR) 5 MG tablet Take 1 tablet (5 mg total) by mouth daily. 08/29/19  Yes McGowan, Larene Beach A, PA-C  lisinopril (PRINIVIL,ZESTRIL) 10 MG tablet Take 10 mg by mouth daily. 12/17/17  Yes [provider]  doxycycline (VIBRA-TABS) 100 MG tablet Take 1 tablet (100 mg total) by mouth every 12 (twelve) hours. Patient not taking: Reported on  09/11/2019 08/25/19   Max Sane, MD  polyethylene glycol (MIRALAX / GLYCOLAX) 17 g packet Take 17 g by mouth daily. Mix one tablespoon with 8oz of your favorite juice or water every day until you are having soft formed stools. Then start taking once daily if you didn't have a stool the day before. 09/11/19   Merlyn Lot, MD  tadalafil (CIALIS) 5 MG tablet Take 1 tablet (5 mg total) by mouth as needed. 08/29/19 08/28/20  Zara Council A, PA-C    Allergies Patient has no known allergies.    Social History Social History   Tobacco Use  . Smoking status: Former Smoker    Years: 60.00    Types: Cigarettes  . Smokeless tobacco: Never Used  . Tobacco comment: quit smoking 59yrs ago  Substance Use Topics  . Alcohol use: Not on file  . Drug use: Not on file    Review of Systems Patient denies headaches, rhinorrhea, blurry vision, numbness, shortness of breath, chest pain, edema, cough, abdominal pain, nausea, vomiting, diarrhea, dysuria, fevers, rashes or hallucinations unless otherwise stated above in HPI. ____________________________________________   PHYSICAL EXAM:  VITAL SIGNS: Vitals:   09/11/19 1930 09/11/19 2000  BP: (!) 131/98 (!) 133/91  Pulse:  99  Resp:  16  Temp:    SpO2: 100% 100%    Constitutional: Alert and oriented.  Eyes: Conjunctivae are normal.  Head: Atraumatic. Nose: No congestion/rhinnorhea.  Mouth/Throat: Mucous membranes are moist.   Neck: No stridor. Painless ROM.  Cardiovascular: Normal rate, regular rhythm. Grossly normal heart sounds.  Good peripheral circulation. Respiratory: Normal respiratory effort.  No retractions. Lungs CTAB. Gastrointestinal: Soft and nontender. No distention. No abdominal bruits. No CVA tenderness. Genitourinary:  Musculoskeletal: No lower extremity tenderness nor edema.  No joint effusions. Neurologic:  Normal speech and language. No gross focal neurologic deficits are appreciated. No facial droop Skin:  Skin is  warm, dry and intact. No rash noted. Psychiatric: Mood and affect are normal. Speech and behavior are normal.  ____________________________________________   LABS (all labs ordered are listed, but only abnormal results are displayed)  Results for orders placed or performed during the hospital encounter of 09/11/19 (from the past 24 hour(s))  CBC with Differential/Platelet     Status: Abnormal   Collection Time: 09/11/19  4:30 PM  Result Value Ref Range   WBC 10.4 4.0 - 10.5 K/uL   RBC 4.59 4.22 - 5.81 MIL/uL   Hemoglobin 12.1 (L) 13.0 - 17.0 g/dL   HCT 36.8 (L) 39.0 - 52.0 %   MCV 80.2 80.0 - 100.0 fL   MCH 26.4 26.0 - 34.0 pg   MCHC 32.9 30.0 - 36.0 g/dL   RDW 13.4 11.5 - 15.5 %   Platelets 412 (H) 150 - 400 K/uL   nRBC 0.0 0.0 - 0.2 %   Neutrophils Relative % 80 %   Neutro Abs 8.2 (H) 1.7 - 7.7 K/uL   Lymphocytes Relative 14 %   Lymphs Abs 1.5 0.7 - 4.0 K/uL   Monocytes Relative 6 %   Monocytes Absolute 0.6 0.1 - 1.0 K/uL   Eosinophils Relative 0 %   Eosinophils Absolute 0.0 0.0 - 0.5 K/uL   Basophils Relative 0 %   Basophils Absolute 0.0 0.0 - 0.1 K/uL   Immature Granulocytes 0 %   Abs Immature Granulocytes 0.03 0.00 - 0.07 K/uL  Basic metabolic panel     Status: Abnormal   Collection Time: 09/11/19  4:30 PM  Result Value Ref Range   Sodium 132 (L) 135 - 145 mmol/L   Potassium 4.3 3.5 - 5.1 mmol/L   Chloride 97 (L) 98 - 111 mmol/L   CO2 24 22 - 32 mmol/L   Glucose, Bld 120 (H) 70 - 99 mg/dL   BUN 24 (H) 8 - 23 mg/dL   Creatinine, Ser 1.79 (H) 0.61 - 1.24 mg/dL   Calcium 9.0 8.9 - 10.3 mg/dL   GFR calc non Af Amer 33 (L) >60 mL/min   GFR calc Af Amer 39 (L) >60 mL/min   Anion gap 11 5 - 15  Urinalysis, Complete w Microscopic     Status: Abnormal   Collection Time: 09/11/19  6:03 PM  Result Value Ref Range   Color, Urine YELLOW (A) YELLOW   APPearance CLEAR (A) CLEAR   Specific Gravity, Urine 1.004 (L) 1.005 - 1.030   pH 6.0 5.0 - 8.0   Glucose, UA NEGATIVE  NEGATIVE mg/dL   Hgb urine dipstick SMALL (A) NEGATIVE   Bilirubin Urine NEGATIVE NEGATIVE   Ketones, ur NEGATIVE NEGATIVE mg/dL   Protein, ur NEGATIVE NEGATIVE mg/dL   Nitrite NEGATIVE NEGATIVE   Leukocytes,Ua NEGATIVE NEGATIVE   RBC / HPF 0-5 0 - 5 RBC/hpf   WBC, UA 0-5 0 - 5 WBC/hpf   Bacteria, UA NONE SEEN NONE SEEN   Squamous Epithelial / LPF 0-5 0 - 5   ____________________________________________ ____________________________________________  RADIOLOGY  I  personally reviewed all radiographic images ordered to evaluate for the above acute complaints and reviewed radiology reports and findings.  These findings were personally discussed with the patient.  Please see medical record for radiology report.  ____________________________________________   PROCEDURES  Procedure(s) performed:  Procedures    Critical Care performed: no ____________________________________________   INITIAL IMPRESSION / ASSESSMENT AND PLAN / ED COURSE  Pertinent labs & imaging results that were available during my care of the patient were reviewed by me and considered in my medical decision making (see chart for details).   DDX: sbo, ileus, aki, bladder obstruction, impaction, colitis  James Oconnor is a 85 y.o. who presents to the ED with symptoms as described above.  Exam is soft and benign.  Review of records makes me concern for bladder obstruction mass bowel obstruction or other acute intra-abdominal pathology therefore CT imaging will be ordered.  Blood work otherwise at his baseline.  Doubt infectious process.  Clinical Course as of Sep 10 2013  Mon Sep 11, 2019  [id number] Patient feels significantly improved after Foley catheter insertion.  Remains hemodynamically stable.  No evidence of bowel obstruction.  We discussed findings of renal mass and I discussed the case in consultation with Dr. Erlene Quan.  No indication for hospitalization just based on mass.  Will observe in the ER to ensure that he  is not having any postobstructive diuresis but if he is feeling well and remains stable I do believe he is appropriate for outpatient follow-up.   [PR]  2008 Patient remains well.  Remains hemodynamically stable.  Able to ambulate with steady gait.  At this point he believe he stable and appropriate for outpatient follow-up.   [PR]    Clinical Course User Index [PR] Merlyn Lot, MD    The patient was evaluated in Emergency Department today for the symptoms described in the history of present illness. He/she was evaluated in the context of the global COVID-19 pandemic, which necessitated consideration that the patient might be at risk for infection with the SARS-CoV-2 virus that causes COVID-19 as well as critical staffing shortages. Institutional protocols and algorithms that pertain to the evaluation of patients at risk for COVID-19 are in a state of rapid change based on information released by regulatory bodies including the CDC and federal and state organizations. These policies and algorithms were followed during the patient's care in the ED.  As part of my medical decision making, I reviewed the following data within the [place] notes reviewed and incorporated, Labs reviewed, notes from prior ED visits and [place] Controlled Substance Database   ____________________________________________   FINAL CLINICAL IMPRESSION(S) / ED DIAGNOSES  Final diagnoses:  Urinary retention  Renal mass  Constipation, unspecified constipation type      NEW MEDICATIONS STARTED DURING THIS VISIT:  New Prescriptions   POLYETHYLENE GLYCOL (MIRALAX / GLYCOLAX) 17 G PACKET    Take 17 g by mouth daily. Mix one tablespoon with 8oz of your favorite juice or water every day until you are having soft formed stools. Then start taking once daily if you didn't have a stool the day before.     Note:  This document was prepared using Dragon voice recognition software and may include  unintentional dictation errors.    Merlyn Lot, MD 09/11/19 2011    Merlyn Lot, MD 09/11/19 2014

## 2019-09-11 NOTE — ED Notes (Signed)
Pt foley drainage bag changed to leg bag per MD Quentin Cornwall. Pt given crackers and water to eat to PO challenge. Pt able to tolerate PO food and liquid. MD aware.

## 2019-09-11 NOTE — ED Triage Notes (Addendum)
Pt arrives with concerns over urinary retention and constipation for the last 2 days. No greater than 200 ml seen via bladder scanner

## 2019-09-11 NOTE — Discharge Instructions (Addendum)
Please follow-up with Dr.Brandon, urology, for evaluation of your bladder retention and Foley catheter.  Please call the clinic in the morning for close follow-up.  Be sure to drink plenty of fluids.  Have also given you a prescription for MiraLAX that I want you to take daily.  He can also try 8 ounces of prune juice mixed with a tablespoon of mineral oil to help with constipation.  You have been seen in the emergency department for emergency care. It is important that you contact your own doctor, specialist or the closest clinic for follow-up care. Please bring this instruction sheet, all medications and X-ray copies with you when you are seen for follow-up care.  Determining the exact cause for all patients with abdominal pain is extremely difficult in the emergency department. Our primary focus is to rule-out immediate life-threatening diseases. If no immediate source of pain is found the definitive diagnosis frequently needs to be determined over time.Many times your primary care physician can determine the cause by following the symptoms over time. Sometimes, specialist are required such as Gastroenterologists, Gynecologists, Urologists or Surgeons. Please return immediately to the Emergency Department for fever>101, Vomiting or Intractable Pain. You should return to the emergency department or see your primary care provider in 12-24hrs if your pain is no better and sooner if your pain becomes worse.

## 2019-09-13 ENCOUNTER — Ambulatory Visit (INDEPENDENT_AMBULATORY_CARE_PROVIDER_SITE_OTHER): Payer: Medicare HMO | Admitting: Urology

## 2019-09-13 ENCOUNTER — Encounter: Payer: Self-pay | Admitting: Urology

## 2019-09-13 ENCOUNTER — Other Ambulatory Visit: Payer: Self-pay

## 2019-09-13 VITALS — BP 132/73 | HR 108 | Ht 67.0 in | Wt 164.0 lb

## 2019-09-13 DIAGNOSIS — Z87898 Personal history of other specified conditions: Secondary | ICD-10-CM

## 2019-09-13 DIAGNOSIS — N401 Enlarged prostate with lower urinary tract symptoms: Secondary | ICD-10-CM

## 2019-09-13 DIAGNOSIS — R339 Retention of urine, unspecified: Secondary | ICD-10-CM | POA: Diagnosis not present

## 2019-09-13 DIAGNOSIS — N138 Other obstructive and reflux uropathy: Secondary | ICD-10-CM

## 2019-09-13 DIAGNOSIS — C642 Malignant neoplasm of left kidney, except renal pelvis: Secondary | ICD-10-CM | POA: Diagnosis not present

## 2019-09-13 NOTE — Progress Notes (Signed)
12/09/2018 2:19 PM   James Oconnor July 22, 1930 [id number]  Referring provider: Dion Body, MD [place],  [place] [address]  Chief Complaint  Patient presents with  . Erectile Dysfunction  . Benign Prostatic Hypertrophy   HPI: James Oconnor is a 85 y.o. male who is DNR with HTN and DM with a history of elevated PSA, BPH with LU TS, urge incontinence, nocturia and RCC who presents for pain with Foley catheter.    Urinary retention He was seen in the emergency department on September 11, 2019 for the complaint of unable to move his bowels and not feeling that he is emptying his bladder completely.  CT on 09/11/2019 revealed mild bilateral hydronephrosis and hydroureter identified. There is diffuse distension of the urinary bladder with bladder wall trabeculation compatible with chronic bladder outlet obstruction.  Foley catheter was placed in the ED.  He has been having pain since the Foley catheter was placed in the heaf of his penis.    History of elevated PSA PSA Trend  3.30 in November 2016  3.24 in February 2018    Component     Latest Ref Rng & Units 09/07/2018 12/07/2018 06/07/2019  Prostate Specific Ag, Serum     0.0 - 4.0 ng/mL 7.2 (H) 2.7 2.8  Started on finasteride at 09/07/2018 visit   MRI prostate on 11/08/2018 was revealed A PI-RADS category 4 lesion of the right posterolateral peripheral zone at the apex is the only lesion of concern identified. Appropriate targeting data sent to [place].  Benign prostatic hypertrophy with marked prostatomegaly, prostate volume 155.77 cubic cm.  Trabeculation of the bladder wall with small bladder diverticula raising the possibility of some degree of chronic outlet obstruction.  Patient deferred pursuing a biopsy at this time.  Repeated PSA 2.8 on 05/2019.  PSAD 0.041.    BPH WITH LUTS  (prostate and/or bladder) Foley in place.  Taking finasteride and tadalafil.   [name] Found incidentally during  recent hospitalization:  MRI on 08/25/2019 revealed in the anterior aspect of the upper pole the left kidney there is a 2.6 x 2.2 x 3.1 cm lesion that is heterogeneous in signal intensity on T1 weighted images (T1 isointense to T1 hyperintense), isointense on T2 weighted images, with avid arterial phase enhancement and some delayed washout, compatible with a solid renal neoplasm, likely a renal cell carcinoma. This appears encapsulated within Gerota's fascia, and is well separated from the left renal vein, which is widely patent.  He is not having flank pain or gross hematuria.  Also visualized on recent CT on 09/11/2019.  Repeat MRI for surveillance planned for 6 months.    PMH: Past Medical History:  Diagnosis Date  . Diabetes mellitus without complication ([place])   . Hypertension     Surgical History: None  Home Medications:  Allergies as of 09/13/2019   No Known Allergies     Medication List       Accurate as of September 13, 2019  2:19 PM. If you have any questions, ask your nurse or doctor.        STOP taking these medications   doxycycline 100 MG tablet Commonly known as: VIBRA-TABS Stopped by: Zara Council, PA-C     TAKE these medications   finasteride 5 MG tablet Commonly known as: PROSCAR Take 1 tablet (5 mg total) by mouth daily.   lisinopril 10 MG tablet Commonly known as: ZESTRIL Take 10 mg by mouth daily.   polyethylene glycol  17 g packet Commonly known as: MIRALAX / GLYCOLAX Take 17 g by mouth daily. Mix one tablespoon with 8oz of your favorite juice or water every day until you are having soft formed stools. Then start taking once daily if you didn't have a stool the day before.   tadalafil 5 MG tablet Commonly known as: CIALIS Take 1 tablet (5 mg total) by mouth as needed.       Allergies: No Known Allergies  Family History: Family History  Problem Relation Age of Onset  . Lung cancer Father   . Heart disease Sister   . Lung cancer Brother      Social History:  reports that he has quit smoking. His smoking use included cigarettes. He quit after 60.00 years of use. He has never used smokeless tobacco. No history on file for alcohol and drug.  ROS: UROLOGY Frequent Urination?: No Hard to postpone urination?: No Burning/pain with urination?: No Get up at night to urinate?: No Leakage of urine?: No Urine stream starts and stops?: No Trouble starting stream?: No Do you have to strain to urinate?: No Blood in urine?: Yes Urinary tract infection?: No Sexually transmitted disease?: No Injury to kidneys or bladder?: No Painful intercourse?: No Weak stream?: No Erection problems?: No Penile pain?: Yes  Gastrointestinal Nausea?: No Vomiting?: No Indigestion/heartburn?: No Diarrhea?: No Constipation?: No  Constitutional Fever: No Night sweats?: No Weight loss?: No Fatigue?: No  Skin Skin rash/lesions?: No Itching?: No  Eyes Blurred vision?: No Double vision?: No  Ears/Nose/Throat Sore throat?: No Sinus problems?: No  Hematologic/Lymphatic Swollen glands?: No Easy bruising?: No  Cardiovascular Leg swelling?: No Chest pain?: No  Respiratory Cough?: No Shortness of breath?: No  Endocrine Excessive thirst?: No  Musculoskeletal Back pain?: No Joint pain?: No  Neurological Headaches?: No Dizziness?: No  Psychologic Depression?: No Anxiety?: No  Physical Exam: BP 132/73   Pulse (!) 108   Ht 5\' 7"  (1.702 m)   Wt 164 lb (74.4 kg)   BMI 25.69 kg/m   Constitutional:  Well nourished. Alert and oriented, No acute distress. HEENT: [place] AT, moist mucus membranes.  Trachea midline, no masses. Cardiovascular: No clubbing, cyanosis, or edema. Respiratory: Normal respiratory effort, no increased work of breathing. GI: Abdomen is soft, non tender, non distended, no abdominal masses.   GU: No CVA tenderness.  No bladder fullness or masses.  Foley in place, but on tension due to the StatLock Foley  stabilizer was place too distal on the thigh.   Neurologic: Grossly intact, no focal deficits, moving all 4 extremities. Psychiatric: Normal mood and affect.   Laboratory Data: n/a I have reviewed the labs.  Pertinent Imaging: CLINICAL DATA:  Urinary retention and constipation for 2 days. Kidney lesion. Evaluate for bowel obstruction.  EXAM: CT ABDOMEN AND PELVIS WITHOUT AND WITH CONTRAST  TECHNIQUE: Multidetector CT imaging of the abdomen and pelvis was performed following the standard protocol before and following the bolus administration of intravenous contrast.  CONTRAST:  <See Chart> OMNIPAQUE IOHEXOL 350 MG/ML SOLN, 62mL OMNIPAQUE IOHEXOL 300 MG/ML SOLN  COMPARISON:  MRI 08/25/2019  FINDINGS: Lower chest: No acute abnormality. Bilateral lower lobe bronchiectasis, ground-glass attenuation and interstitial reticulation is identified concerning for chronic interstitial lung disease.  Hepatobiliary: Large cyst identified within the dome of liver measures 8 cm. No suspicious enhancing liver abnormality. Gallbladder is unremarkable. No biliary ductal dilatation identified.  Pancreas: Unremarkable. No pancreatic ductal dilatation or surrounding inflammatory changes.  Spleen: Normal in size without focal abnormality.  Adrenals/Urinary Tract: The adrenal glands are normal. Bilateral kidney stones are identified. The largest is in the inferior pole of the right kidney measuring 8 mm, image 68/11. Multiple bilateral simple appearing kidney cysts are identified. The largest arises from the inferior pole of left kidney measuring 4.3 cm. Exophytic, solid enhancing lesion arising from upper pole of the left kidney measures 2.9 cm, image 46/6 and is compatible with a renal cell carcinoma.  Mild bilateral hydronephrosis and hydroureter identified. There is diffuse distension of the urinary bladder with bladder wall trabeculation compatible with chronic bladder  outlet obstruction.  Stomach/Bowel: Stomach is nondistended. No small bowel wall thickening, inflammation or distension. There is mild distension of the colon to the level of the splenic flexure. A moderate stool burden is noted within the ascending colon and transverse colon. Normal caliber distal colon. Scattered distal colonic diverticula noted without acute inflammation.  Vascular/Lymphatic: Aortic atherosclerosis. No aneurysm. No abdominopelvic adenopathy identified.  Reproductive: Marked enlargement of the prostate gland measures 6.8 by 6.9 by 7.4 cm (volume = 180 cm^3). This has mass effect upon the bladder base in may be resulting in chronic bladder outlet obstruction.  Other: No free fluid or fluid collections.  Musculoskeletal: Thoracolumbar degenerative disc disease. No acute or suspicious osseous abnormality identified.  IMPRESSION: 1. No evidence for high-grade bowel obstruction. A moderate stool burden is noted within the ascending colon and transverse colon which is mildly increased in caliber. 2. Marked enlargement of the prostate gland with secondary changes of chronic bladder outlet obstruction. Secondary mild bilateral hydronephrosis and hydroureter noted. 3. Bilateral nephrolithiasis 4. Solid enhancing left kidney lesion compatible with renal cell carcinoma. 5. Bilateral kidney cysts. 6. Bronchiectasis, ground-glass attenuation and interstitial reticulation noted within the lung bases concerning for chronic interstitial lung disease.   Electronically Signed   By: Kerby Moors M.D.   On: 09/11/2019 17:57 I have independently reviewed the films and appreciate the findings of an enlarged prostate and a bladder distention.   Assessment & Plan:    1. Catheter discomfort Foley found on tension - catheter repositioned - patient more comfortable  2. BPH with retention Patient will have an appointment with Dr. Diamantina Providence to discuss HoLEP on Monday  09/18/2019  3. RCC Discussed with patient that approximately 20% are benign, 60% appear to be relatively indolent renal cell carcinoma and 20% demonstrated potentially aggressive histological features. Management options consist of a radical nephrectomy, partial nephrectomy or active surveillance - due to his advanced age we have decided to pursue active surveillance at this time and he will return in 6 months for a repeated MRI  4. History of elevated PSA MRI of prostate notes PI-RADS 4 lesion, but patient has deferred biopsy due to his advanced age 40 recent PSA 2.8 in 05/2019    Return for Appointment with Dr. Diamantina Providence to discuss HoLEP .  Zara Council, PA-C  [facility] [address]  [place], [place] [address] [phone]

## 2019-09-14 ENCOUNTER — Encounter: Payer: Medicare HMO | Admitting: Physical Therapy

## 2019-09-17 ENCOUNTER — Emergency Department
Admission: EM | Admit: 2019-09-17 | Discharge: 2019-09-17 | Disposition: A | Discharge status: Home / Self Care | Payer: Medicare HMO | Attending: Student | Admitting: Student

## 2019-09-17 ENCOUNTER — Other Ambulatory Visit: Payer: Self-pay

## 2019-09-17 DIAGNOSIS — Y69 Unspecified misadventure during surgical and medical care: Secondary | ICD-10-CM | POA: Diagnosis not present

## 2019-09-17 DIAGNOSIS — T839XXA Unspecified complication of genitourinary prosthetic device, implant and graft, initial encounter: Secondary | ICD-10-CM

## 2019-09-17 DIAGNOSIS — Z87891 Personal history of nicotine dependence: Secondary | ICD-10-CM | POA: Diagnosis not present

## 2019-09-17 DIAGNOSIS — E119 Type 2 diabetes mellitus without complications: Secondary | ICD-10-CM | POA: Insufficient documentation

## 2019-09-17 DIAGNOSIS — I10 Essential (primary) hypertension: Secondary | ICD-10-CM | POA: Diagnosis not present

## 2019-09-17 DIAGNOSIS — Z79899 Other long term (current) drug therapy: Secondary | ICD-10-CM | POA: Diagnosis not present

## 2019-09-17 NOTE — ED Notes (Signed)
See triage note  States he had a cath placed on Monday  Was then seen by urologist.. States they moved it around and then he developed burning

## 2019-09-17 NOTE — Discharge Instructions (Addendum)
We have adjusted the straps on your catheter. Feel free to further adjust them to ensure a snug fit. Follow-up with Urology as scheduled. Return as needed.

## 2019-09-17 NOTE — ED Triage Notes (Signed)
Pt had a urinary catheter placed and has had follow up with urology on Tuesday and they made an adjustment, pt states the straps they put on there are not working and the bag falls and pulls on the catheter.

## 2019-09-17 NOTE — ED Provider Notes (Signed)
[facility] Emergency Department Provider Note ____________________________________________  Time seen: 1057  I have reviewed the triage vital signs and the nursing notes.  HISTORY  Chief Complaint  Painful catheter  HPI James Oconnor is a 85 y.o. male presents to the ED for evaluation management of his Foley catheter.  Patient reports he was seen here in the ED  about a week prior, for urinary retention.  A urinary catheter was placed at that time.  He is apparently had a subsequent interval visit with urology about 4 days prior.  He describes they made some adjustments to the catheter, and since that time is had burning discomfort as well as sensation of the leg bag is slipping down his leg.  He denies any leakage from the catheter, hematuria, dysuria, or urinary retention.  He presents here with request to adjust the leg bag for improved comfort.  No other complaints at this time.  Past Medical History:  Diagnosis Date  . Diabetes mellitus without complication ([employer])   . Hypertension     Patient Active Problem List   Diagnosis Date Noted  . Chest pain 08/24/2019  . Other male erectile dysfunction 05/31/2019  . History of normocytic normochromic anemia 01/11/2019  . DNR (do not resuscitate) 01/07/2017  . Essential hypertension 05/10/2014  . BPH (benign prostatic hyperplasia) 11/11/2012    Past Surgical History:  Procedure Laterality Date  . none      Prior to Admission medications   Medication Sig Start Date End Date Taking? Authorizing Provider  finasteride (PROSCAR) 5 MG tablet Take 1 tablet (5 mg total) by mouth daily. 08/29/19   Zara Council A, PA-C  lisinopril (PRINIVIL,ZESTRIL) 10 MG tablet Take 10 mg by mouth daily. 12/17/17   [provider]  polyethylene glycol (MIRALAX / GLYCOLAX) 17 g packet Take 17 g by mouth daily. Mix one tablespoon with 8oz of your favorite juice or water every day until you are having soft formed stools. Then  start taking once daily if you didn't have a stool the day before. 09/11/19   Merlyn Lot, MD  tadalafil (CIALIS) 5 MG tablet Take 1 tablet (5 mg total) by mouth as needed. 08/29/19 08/28/20  Zara Council A, PA-C    Allergies Patient has no known allergies.  Family History  Problem Relation Age of Onset  . Lung cancer Father   . Heart disease Sister   . Lung cancer Brother     Social History Social History   Tobacco Use  . Smoking status: Former Smoker    Years: 60.00    Types: Cigarettes  . Smokeless tobacco: Never Used  . Tobacco comment: quit smoking 74yrs ago  Substance Use Topics  . Alcohol use: Not Currently  . Drug use: Not Currently    Review of Systems  Constitutional: Negative for fever. Cardiovascular: Negative for chest pain. Respiratory: Negative for shortness of breath. Gastrointestinal: Negative for abdominal pain, vomiting and diarrhea. Genitourinary: Negative for dysuria or retention. Problem with Foley, as above.  Musculoskeletal: Negative for back pain. Skin: Negative for rash. Neurological: Negative for headaches, focal weakness or numbness. ____________________________________________  PHYSICAL EXAM:  VITAL SIGNS: ED Triage Vitals  Enc Vitals Group     BP 09/17/19 1024 114/63     Pulse Rate 09/17/19 1024 (!) 113     Resp 09/17/19 1024 16     Temp 09/17/19 1024 98.5 F (36.9 C)     Temp Source 09/17/19 1024 Oral     SpO2 09/17/19  1024 96 %     Weight --      Height --      Head Circumference --      Peak Flow --      Pain Score 09/17/19 1035 2     Pain Loc --      Pain Edu? --      Excl. in [place]? --     Constitutional: Alert and oriented. Well appearing and in no distress. Head: Normocephalic and atraumatic. Eyes: Conjunctivae are normal. Normal extraocular movements Cardiovascular: Normal rate, regular rhythm. Normal distal pulses. Respiratory: Normal respiratory effort. No wheezes/rales/rhonchi. Gastrointestinal: Soft and  nontender. No distention. Musculoskeletal: Nontender with normal range of motion in all extremities.  Neurologic:  Normal gait without ataxia. Normal speech and language. No gross focal neurologic deficits are appreciated. Skin:  Skin is warm, dry and intact. No rash noted. ____________________________________________  PROCEDURES  Leg bag straps adjusted to patient comfort. Procedures ____________________________________________  INITIAL IMPRESSION / ASSESSMENT AND PLAN / ED COURSE  Geriatric patient with ED evaluation of Foley catheter leg bag disc comfort.  Patient presents for adjustment of his Foley catheter and leg bag straps.  He reports significant improvement after nurse intervention.  Patient is discharged to follow-up with urology in 1 day as scheduled.  Return precautions have been reviewed.  Georgie Shambach was evaluated in Emergency Department on 09/17/2019 for the symptoms described in the history of present illness. He was evaluated in the context of the global COVID-19 pandemic, which necessitated consideration that the patient might be at risk for infection with the SARS-CoV-2 virus that causes COVID-19. Institutional protocols and algorithms that pertain to the evaluation of patients at risk for COVID-19 are in a state of rapid change based on information released by regulatory bodies including the CDC and federal and state organizations. These policies and algorithms were followed during the patient's care in the ED. ____________________________________________  FINAL CLINICAL IMPRESSION(S) / ED DIAGNOSES  Final diagnoses:  Foley catheter problem, initial encounter [facility])     Carmie End, Dannielle Karvonen, PA-C 09/17/19 1207    Lilia Pro., MD 09/18/19 1225

## 2019-09-18 ENCOUNTER — Encounter: Payer: Self-pay | Admitting: Urology

## 2019-09-18 ENCOUNTER — Ambulatory Visit
Admission: RE | Admit: 2019-09-18 | Discharge: 2019-09-18 | Disposition: A | Discharge status: Home / Self Care | Payer: Medicare HMO | Source: Ambulatory Visit | Attending: Gastroenterology | Admitting: Gastroenterology

## 2019-09-18 ENCOUNTER — Ambulatory Visit (INDEPENDENT_AMBULATORY_CARE_PROVIDER_SITE_OTHER): Payer: Medicare HMO | Admitting: Urology

## 2019-09-18 VITALS — BP 106/70 | HR 106 | Ht 67.0 in | Wt 160.0 lb

## 2019-09-18 DIAGNOSIS — R933 Abnormal findings on diagnostic imaging of other parts of digestive tract: Secondary | ICD-10-CM | POA: Insufficient documentation

## 2019-09-18 DIAGNOSIS — R339 Retention of urine, unspecified: Secondary | ICD-10-CM | POA: Diagnosis not present

## 2019-09-18 DIAGNOSIS — R0989 Other specified symptoms and signs involving the circulatory and respiratory systems: Secondary | ICD-10-CM | POA: Diagnosis present

## 2019-09-18 MED ORDER — IOHEXOL 300 MG/ML  SOLN
60.0000 mL | Freq: Once | INTRAMUSCULAR | Status: AC | PRN
  Administered 2019-09-18: 60 mL via INTRAVENOUS

## 2019-09-18 NOTE — Progress Notes (Signed)
   09/18/2019 1:46 PM   James Oconnor 29-Nov-1930 [id number]  Reason for visit: Discuss HoLEP  HPI: I saw [name] in urology clinic today to discuss HOLEP.  He has previously been followed in our clinic by Zara Council, PA for BPH with urinary symptoms, as well as mildly elevated PSA.  To briefly summarize, he underwent a prostate MRI in October 2019 for an elevated PSA of 7.2(has intermittently also been on finasteride) which showed a 150 g gland with a small 1 cm PI-RADS 4 lesion.  He deferred biopsy of this with his age and co-morbidities.  He has well-controlled diabetes.  He presented to the ED on 09/11/2019 with acute onset urinary retention with distended bladder on CT with mild bilateral hydronephrosis, and a Foley was placed.  He also has a 3 cm enhancing lesion of the left kidney worrisome for RCC and is currently on active surveillance.  He reports a chronic history of severe urinary symptoms for 10+ years, that have worsened over the last 6 to 12 months.   IPSS score in clinic today as 31, with quality of life mostly dissatisfied.  His PVRs have been greater than 200 mL in the past.  Prior to this episode, he denies any episodes of urinary retention, or UTIs.  We discussed the risks and benefits of HOLEP at length.  The procedure requires general anesthesia and takes 2 to 3 hours, and a holmium laser is used to enucleate the prostate and push this tissue into the bladder.  A morcellator is then used to remove this tissue, which is sent for pathology.  Majority of patients are able to discharge the same day with a catheter in place for 2 to 3 days, and will follow-up in clinic for a voiding trial.  Approximately 10% of patients will be admitted overnight to monitor the urine, or if they have multiple comorbidities.  We specifically discussed the risks of bleeding, infection, retrograde ejaculation, temporary urgency and urge incontinence, very low risk of long-term incontinence, and  possible need for additional procedures.  -He will need cardiology clearance with recent diagnosis of a 4 cm aortic arch aneurysm, as well as reported history of unstable angina -Schedule HoLEP if cleared from above -Follow-up later this week for a void trial in the morning with nurse visit now that he is no longer constipated  A total of 25 minutes were spent face-to-face with the patient, greater than 50% was spent in patient education, counseling, and coordination of care regarding HOLEP.  Billey Co, [facility] [address] [place], [place] [address] [phone]

## 2019-09-18 NOTE — Patient Instructions (Signed)

## 2019-09-19 ENCOUNTER — Ambulatory Visit: Payer: Medicare HMO

## 2019-09-19 ENCOUNTER — Other Ambulatory Visit: Payer: Self-pay

## 2019-09-19 ENCOUNTER — Ambulatory Visit: Payer: Medicare HMO | Admitting: Urology

## 2019-09-19 ENCOUNTER — Ambulatory Visit (INDEPENDENT_AMBULATORY_CARE_PROVIDER_SITE_OTHER): Payer: Medicare HMO | Admitting: Urology

## 2019-09-19 ENCOUNTER — Encounter: Payer: Self-pay | Admitting: Urology

## 2019-09-19 VITALS — BP 101/62 | HR 52 | Wt 161.0 lb

## 2019-09-19 DIAGNOSIS — R339 Retention of urine, unspecified: Secondary | ICD-10-CM | POA: Diagnosis not present

## 2019-09-19 LAB — URINALYSIS, COMPLETE
Bilirubin, UA: NEGATIVE
Glucose, UA: NEGATIVE
Ketones, UA: NEGATIVE
Nitrite, UA: POSITIVE — AB
Specific Gravity, UA: 1.01 (ref 1.005–1.030)
Urobilinogen, Ur: 1 mg/dL (ref 0.2–1.0)
pH, UA: 7 (ref 5.0–7.5)

## 2019-09-19 LAB — MICROSCOPIC EXAMINATION: WBC, UA: >30 /hpf — AB (ref 0–5)

## 2019-09-19 LAB — BLADDER SCAN AMB NON-IMAGING

## 2019-09-19 NOTE — Progress Notes (Signed)
Fill and Pull Catheter Removal  Patient is present today for a catheter removal. 76ml of water was then drained from the balloon.  A 16FR foley cath was removed from the bladder no complications were noted . Patient instructed to drink plenty of water and come back in the afternoon to have a post void scan.  Performed by: S. Rasheda Ledger, CMA(AAMA)  Follow up/ Additional notes: return this afternoon for PVR.

## 2019-09-19 NOTE — Progress Notes (Signed)
Continuous Intermittent Catheterization  Due to urinary retention patient is present today for a teaching of self I & O Catheterization. Patient was given detailed verbal and printed instructions of self catheterization. Patient was cleaned and prepped in a sterile fashion.  With instruction and assistance patient inserted a 14FR and urine return was noted 300 ml, urine was straw in color. Patient tolerated well, no complications were noted Patient was given a sample bag with supplies to take home.  Instructions were given per Rulon Sera for patient to cath 2 times daily.  An order was placed with for catheters to be sent to the patient's home.  Preformed by: Rulon Sera, S. Levens   I spent 15 minutes with this patient in a face to face conversation of which greater than 50% was spent in counseling and coordination of care with the patient instructing on self cathing.

## 2019-09-20 ENCOUNTER — Ambulatory Visit (INDEPENDENT_AMBULATORY_CARE_PROVIDER_SITE_OTHER): Payer: Medicare HMO | Admitting: Urology

## 2019-09-20 VITALS — BP 178/91 | HR 121 | Ht 67.0 in | Wt 160.0 lb

## 2019-09-20 DIAGNOSIS — N401 Enlarged prostate with lower urinary tract symptoms: Secondary | ICD-10-CM

## 2019-09-20 DIAGNOSIS — N138 Other obstructive and reflux uropathy: Secondary | ICD-10-CM

## 2019-09-20 DIAGNOSIS — R310 Gross hematuria: Secondary | ICD-10-CM

## 2019-09-20 DIAGNOSIS — R339 Retention of urine, unspecified: Secondary | ICD-10-CM

## 2019-09-20 LAB — CULTURE, URINE COMPREHENSIVE

## 2019-09-20 LAB — BLADDER SCAN AMB NON-IMAGING

## 2019-09-20 NOTE — Progress Notes (Signed)
09/20/2019 8:13 AM   James Oconnor 1929-12-10 [id number]  Referring provider: Dion Body, MD [facility] [place],  [place] [address]  Chief Complaint  Patient presents with  . Hematuria    HPI: James Oconnor is an 85 year old male with BPH with retention, history of elevated PSA and RCC who presents today with the complaint of the inability to urinate.    He had been self cathing since yesterday.  He didn't have too much of an issue but he states he would see blood in the self cath, but it would clear by the end of the stream.  He has not cathed this morning as he is was uncomfortable.    He is very uncomfortable and cannot urinate.  His pain is in the suprapubic region.  Patient denies any fevers, chills, nausea or vomiting.  Bladder scan shows 661 mL.    PMH: Past Medical History:  Diagnosis Date  . Diabetes mellitus without complication ([place])   . Hypertension     Surgical History: Past Surgical History:  Procedure Laterality Date  . none      Home Medications:  Allergies as of 09/20/2019   No Known Allergies     Medication List       Accurate as of September 20, 2019 11:59 PM. If you have any questions, ask your nurse or doctor.        finasteride 5 MG tablet Commonly known as: PROSCAR Take 1 tablet (5 mg total) by mouth daily.   lisinopril 10 MG tablet Commonly known as: ZESTRIL Take 10 mg by mouth daily.   polyethylene glycol 17 g packet Commonly known as: MIRALAX / GLYCOLAX Take 17 g by mouth daily. Mix one tablespoon with 8oz of your favorite juice or water every day until you are having soft formed stools. Then start taking once daily if you didn't have a stool the day before.   tadalafil 5 MG tablet Commonly known as: CIALIS Take 1 tablet (5 mg total) by mouth as needed.       Allergies: No Known Allergies  Family History: Family History  Problem Relation Age of Onset  . Lung cancer Father   . Heart  disease Sister   . Lung cancer Brother     Social History:  reports that he has quit smoking. His smoking use included cigarettes. He quit after 60.00 years of use. He has never used smokeless tobacco. He reports previous alcohol use. He reports previous drug use.  ROS: UROLOGY Frequent Urination?: Yes Hard to postpone urination?: Yes Burning/pain with urination?: Yes Get up at night to urinate?: Yes Leakage of urine?: Yes Urine stream starts and stops?: Yes Trouble starting stream?: Yes Do you have to strain to urinate?: Yes Blood in urine?: Yes Urinary tract infection?: No Sexually transmitted disease?: No Injury to kidneys or bladder?: No Painful intercourse?: No Weak stream?: Yes Erection problems?: No Penile pain?: No  Gastrointestinal Nausea?: No Vomiting?: No Indigestion/heartburn?: No Diarrhea?: No Constipation?: No  Constitutional Fever: No Night sweats?: No Weight loss?: Yes Fatigue?: No  Skin Skin rash/lesions?: No Itching?: No  Eyes Blurred vision?: No Double vision?: No  Ears/Nose/Throat Sore throat?: No Sinus problems?: No  Hematologic/Lymphatic Swollen glands?: No Easy bruising?: No  Cardiovascular Leg swelling?: No Chest pain?: Yes  Respiratory Cough?: Yes Shortness of breath?: Yes  Endocrine Excessive thirst?: No  Musculoskeletal Back pain?: No Joint pain?: Yes  Neurological Headaches?: No Dizziness?: No  Psychologic Depression?:  No Anxiety?: No  Physical Exam: BP (!) 178/91   Pulse (!) 121   Ht 5\' 7"  (1.702 m)   Wt 160 lb (72.6 kg)   BMI 25.06 kg/m   Constitutional:  Well nourished. Alert and oriented, No acute distress. HEENT:  AT, moist mucus membranes.  Trachea midline, no masses. Cardiovascular: No clubbing, cyanosis, or edema. Respiratory: Normal respiratory effort, no increased work of breathing. GI: Abdomen is rigid, tender, non distended, no abdominal masses.  GU: No CVA tenderness.  No bladder  fullness or masses.  Patient uncircumcised phallus.  Foreskin easily retracted  Urethral meatus is patent.  No penile discharge. No penile lesions or rashes. Scrotum without lesions, cysts, rashes and/or edema.   Skin: No rashes, bruises or suspicious lesions. Neurologic: Grossly intact, no focal deficits, moving all 4 extremities. Psychiatric: Normal mood and affect.  Laboratory Data: Lab Results  Component Value Date   WBC 10.4 09/11/2019   HGB 12.1 (L) 09/11/2019   HCT 36.8 (L) 09/11/2019   MCV 80.2 09/11/2019   PLT 412 (H) 09/11/2019    Lab Results  Component Value Date   CREATININE 1.79 (H) 09/11/2019    No results found for: PSA  No results found for: TESTOSTERONE  No results found for: HGBA1C  No results found for: TSH     Component Value Date/Time   CHOL 144 08/25/2019 0641   HDL 55 08/25/2019 0641   CHOLHDL 2.6 08/25/2019 0641   VLDL 15 08/25/2019 0641   LDLCALC 74 08/25/2019 0641    No results found for: AST No results found for: ALT No components found for: ALKALINEPHOPHATASE No components found for: BILIRUBINTOTAL  No results found for: ESTRADIOL  Urinalysis    Component Value Date/Time   COLORURINE YELLOW (A) 09/11/2019 1803   APPEARANCEUR Cloudy (A) 09/20/2019 1302   LABSPEC 1.004 (L) 09/11/2019 1803   PHURINE 6.0 09/11/2019 1803   GLUCOSEU Trace (A) 09/20/2019 1302   HGBUR SMALL (A) 09/11/2019 1803   BILIRUBINUR Negative 09/20/2019 1302   KETONESUR NEGATIVE 09/11/2019 1803   PROTEINUR 3+ (A) 09/20/2019 1302   PROTEINUR NEGATIVE 09/11/2019 1803   NITRITE Positive (A) 09/20/2019 1302   NITRITE NEGATIVE 09/11/2019 1803   LEUKOCYTESUR 3+ (A) 09/20/2019 1302   LEUKOCYTESUR NEGATIVE 09/11/2019 1803    I have reviewed the labs.  Procedure Simple Catheter Placement Due to urinary retention patient is present today for a foley cath placement.  Patient was cleaned and prepped in a sterile fashion with betadine and lidocaine jelly 2% was  instilled into the urethra.  A 18 FR Coude catheter was inserted, urine return was noted  550 ml, urine was dark red in color.  The balloon was filled with 10cc of sterile water.  A leg bag was attached for drainage. Patient was also given a night bag to take home and was given instruction on how to change from one bag to another.  Patient was given instruction on proper catheter care.  Patient tolerated well, no complications were noted   Performed by: Zara Council, PA-C and Amado Coe, CMA   Bladder Irrigation Due to gross hematuria patient is present today for a bladder irrigation. Patient was cleaned and prepped in a sterile fashion. 500 ml of saline/sterile water was instilled and irrigated into the bladder with a 48ml Toomey syringe through the catheter in place. No clots were seen and urine returned very light pink.  After irrigation urine flow was noted no complications were noted catheter is now  draining fine.  Catheter was reattached to the leg bag for drainage. Patient tolerated well.   Performed by: Zara Council, PA-C and Amado Coe, CMA   Pertinent Imaging: Results for James Oconnor, James Oconnor (MRN [id number]) as of 09/20/2019 15:40  Ref. Range 09/20/2019 11:45  Scan Result Unknown 670mL     Assessment & Plan:    1. Urinary retention due to clot - Foley in now in place draining light pink urine - BLADDER SCAN AMB NON-IMAGING - Urinalysis, Complete - CULTURE, URINE COMPREHENSIVE  2. BPH with obstruction/lower urinary tract symptoms - now with retention - HoLEP pending  - BLADDER SCAN AMB NON-IMAGING - Urinalysis, Complete - CULTURE, URINE COMPREHENSIVE  3. Gross hematuria - likely due to catheter trauma  - BLADDER SCAN AMB NON-IMAGING - Urinalysis, Complete - CULTURE, URINE COMPREHENSIVE  4. History of elevated PSA MRI of prostate notes PI-RADS 4 lesion, but patient has deferred biopsy due to his advanced age 71 recent PSA 2.8 in 05/2019  5. RCC 3 cm Left  renal mass concerning for renal cell carcinoma pursing active surveillance - MRI in 6 months    Return for HoLEP pending .  These notes generated with voice recognition software. I apologize for typographical errors.  Zara Council, PA-C  [facility] [address]  [place], [place] [address] [phone]

## 2019-09-21 LAB — URINALYSIS, COMPLETE
Bilirubin, UA: NEGATIVE
Nitrite, UA: POSITIVE — AB
Specific Gravity, UA: 1.015 (ref 1.005–1.030)
Urobilinogen, Ur: 8 mg/dL — ABNORMAL HIGH (ref 0.2–1.0)
pH, UA: 6 (ref 5.0–7.5)

## 2019-09-21 LAB — MICROSCOPIC EXAMINATION: RBC, Urine: >30 /hpf — AB (ref 0–2)

## 2019-09-23 LAB — CULTURE, URINE COMPREHENSIVE

## 2019-09-25 ENCOUNTER — Telehealth: Payer: Self-pay | Admitting: Physician Assistant

## 2019-09-25 ENCOUNTER — Telehealth: Payer: Self-pay

## 2019-09-25 ENCOUNTER — Other Ambulatory Visit: Payer: Self-pay | Admitting: Radiology

## 2019-09-25 ENCOUNTER — Encounter: Payer: Self-pay | Admitting: Urology

## 2019-09-25 DIAGNOSIS — R339 Retention of urine, unspecified: Secondary | ICD-10-CM

## 2019-09-25 DIAGNOSIS — N401 Enlarged prostate with lower urinary tract symptoms: Secondary | ICD-10-CM

## 2019-09-25 DIAGNOSIS — N138 Other obstructive and reflux uropathy: Secondary | ICD-10-CM

## 2019-09-25 MED ORDER — SULFAMETHOXAZOLE-TRIMETHOPRIM 800-160 MG PO TABS: 1.0000 | ORAL_TABLET | Freq: Two times a day (BID) | ORAL | 0 refills | Status: DC

## 2019-09-25 NOTE — Telephone Encounter (Signed)
   Primary Cardiologist:Timothy Rockey Situ, MD  Chart reviewed as part of pre-operative protocol coverage. Because of Conny Beri past medical history and time since last visit, he/she will require a follow-up visit in order to better assess preoperative cardiovascular risk.  We have only seen this patient once in consult during a recent hospitalization. We did not prescribe his ASA. He will need an appt with Christell Faith [facility] or Dr. Rockey Situ for clearance.  Pre-op covering staff: - Please schedule appointment and call patient to inform them. - Please contact requesting surgeon's office via preferred method (i.e, phone, fax) to inform them of need for appointment prior to surgery.  If applicable, this message will also be routed to pharmacy pool and/or primary cardiologist for input on holding anticoagulant/antiplatelet agent as requested below so that this information is available at time of patient's appointment.   Tami Lin Jazmen Lindenbaum, PA  09/25/2019, 3:56 PM

## 2019-09-25 NOTE — Telephone Encounter (Signed)
-----   Message from Nori Riis, PA-C sent at 09/25/2019  7:55 AM EDT ----- Please notify Mr. Dintino that his urine culture is positive for infection and we need to start Septra DS, BID x 7 days.

## 2019-09-25 NOTE — Telephone Encounter (Signed)
Patient has appointment with Christell Faith, PA on 09/28/2019

## 2019-09-25 NOTE — Telephone Encounter (Signed)
   [facility] Pre-operative Risk Assessment    Request for surgical clearance:  1. What type of surgery is being performed? Holmium lser enucleation of the prostate  2. When is this surgery scheduled? 10/13/19  3. What type of clearance is required (medical clearance vs. Pharmacy clearance to hold med vs. Both)? both  4. Are there any medications that need to be held prior to surgery and how long? Aspirin 7 days prior 5. Practice name and name of physician performing surgery? [facility], Dr. Mechele Collin   6. What is your office phone number [phone]   7.   What is your office fax number [phone]  8.   Anesthesia type (None, local, MAC, general) ? Not listed   James Oconnor 09/25/2019, 3:35 PM  _________________________________________________________________   (provider comments below)

## 2019-09-25 NOTE — Telephone Encounter (Signed)
Spoke with patient and informed him of results and recommendations.  Rx sent to pharmacy.

## 2019-09-26 NOTE — Progress Notes (Signed)
Cardiology Office Note    Date:  09/28/2019   ID:  James Oconnor, DOB 06-21-1930, MRN [id number]  PCP:  Dion Body, MD  Cardiologist:  Ida Rogue, MD  Electrophysiologist:  None   Chief Complaint: Preoperative cardiac evaluation  History of Present Illness:   James Oconnor is a 85 y.o. male with history of aortic arch dilatation of 4 cm by CTA chest in 08/2019, probable RCC, bladder outlet obstruction, DM2,HTN, dysphagia, interstitial lung disease, anemia, and renal stones who presents for preoperative cardiac evaluation.   Patient has no previously known cardiac history. He was admitted in late 08/2019 with subjective fevers and cough productive of yellow sputum as well as dysphagia. He indicated some mild left-sided chest discomfort. In this setting, cardiology was asked to evaluate. High sensitivity troponin of 105 with a delta of 80. CXR showed mild cardiomegaly with trace bilateral pleural effusions, atelectasis, and aortic atherosclerosis. Echo showed normal LVSF with an EF of 60-65%, mild LVH, DD, normal RVSF and cavity size, mild MR. CTA chest was negative for PE with minimal coronary artery calcification and mild aortic atherosclerosis. Incidentally, a left renal mass was noted with follow up MRI highly concerning for RCC. Symptoms were consistent with demand ischemia in the setting of possible bronchitis. No further inpatient cardiac workup was advised. He has since been seen in the ED twice for urinary rentention issues.   He is scheduled to undergo laser enucleation of the prostate on 10/13/2019.   He comes in accompanied by one of his daughter today.  He is doing well from a cardiac perspective and denies any chest pain, shortness of breath, palpitations, dizziness, presyncope, syncope.  His appetite is improving.  No lower extremity swelling, abdominal distention, orthopnea, PND, early satiety.  No falls, BRBPR, or melena.  He has subsequently seen cardiothoracic surgery at  [facility] with regards to his dilated aortic arch with recommendation to follow-up imaging in approximately 6 months.  He has been taken off lisinopril secondary to soft BP in the 90s over 50s while seen at [facility].  He is able to achieve greater than 4 METs without cardiac limitation.  He has follow-up for his probable renal cell carcinoma as well.   Labs: 08/2019 - potassium 4.3, BUN 24, SCr 1.79, HGB 12.1, PLT 412 08/2019 - TC 144, TG 75, HDL 55, LDL 74  Past Medical History:  Diagnosis Date  . Diabetes mellitus without complication ([place])   . Hypertension     Past Surgical History:  Procedure Laterality Date  . none      Current Medications: Current Meds  Medication Sig  . polyethylene glycol (MIRALAX / GLYCOLAX) 17 g packet Take 17 g by mouth daily. Mix one tablespoon with 8oz of your favorite juice or water every day until you are having soft formed stools. Then start taking once daily if you didn't have a stool the day before.  . sulfamethoxazole-trimethoprim (BACTRIM DS) 800-160 MG tablet Take 1 tablet by mouth 2 (two) times daily.  . tadalafil (CIALIS) 5 MG tablet Take 1 tablet (5 mg total) by mouth as needed.    Allergies:   Patient has no known allergies.   Social History   Socioeconomic History  . Marital status: Married    Spouse name: Not on file  . Number of children: 4  . Years of education: Not on file  . Highest education level: Not on file  Occupational History  . Not on file  Social Needs  . Emergency planning/management officer  strain: Not on file  . Food insecurity    Worry: Not on file    Inability: Not on file  . Transportation needs    Medical: Not on file    Non-medical: Not on file  Tobacco Use  . Smoking status: Former Smoker    Years: 60.00    Types: Cigarettes  . Smokeless tobacco: Never Used  . Tobacco comment: quit smoking 35yrs ago  Substance and Sexual Activity  . Alcohol use: Not Currently  . Drug use: Not Currently  . Sexual activity: Not on file   Lifestyle  . Physical activity    Days per week: Not on file    Minutes per session: Not on file  . Stress: Not on file  Relationships  . Social Herbalist on phone: Not on file    Gets together: Not on file    Attends religious service: Not on file    Active member of club or organization: Not on file    Attends meetings of clubs or organizations: Not on file    Relationship status: Not on file  Other Topics Concern  . Not on file  Social History Narrative  . Not on file     Family History:  The patient's family history includes Heart disease in his sister; Lung cancer in his brother and father.  ROS:   Review of Systems  Constitutional: Positive for malaise/fatigue. Negative for chills, diaphoresis, fever and weight loss.  HENT: Negative for congestion.   Eyes: Negative for discharge and redness.  Respiratory: Negative for cough, hemoptysis, sputum production, shortness of breath and wheezing.   Cardiovascular: Negative for chest pain, palpitations, orthopnea, claudication, leg swelling and PND.  Gastrointestinal: Negative for abdominal pain, blood in stool, heartburn, melena, nausea and vomiting.  Genitourinary: Negative for hematuria.  Musculoskeletal: Negative for falls and myalgias.  Skin: Negative for rash.  Neurological: Negative for dizziness, tingling, tremors, sensory change, speech change, focal weakness, loss of consciousness and weakness.  Endo/Heme/Allergies: Does not bruise/bleed easily.  Psychiatric/Behavioral: Negative for substance abuse. The patient is not nervous/anxious.   All other systems reviewed and are negative.    EKGs/Labs/Other Studies Reviewed:    Studies reviewed were summarized above. The additional studies were reviewed today:  2D echo 08/25/2019: 1. Left ventricular ejection fraction, by visual estimation, is 60 to 65%. The left ventricle has normal function. Normal left ventricular size. There is mildly increased left  ventricular hypertrophy.  2. Left ventricular diastolic Doppler parameters are consistent with impaired relaxation pattern of LV diastolic filling.  3. Global right ventricle has normal systolic function.The right ventricular size is normal. No increase in right ventricular wall thickness.  4. Left atrial size was normal.  5. TR signal is inadequate for assessing pulmonary artery systolic pressure.  6. The inferior vena cava is normal in size with greater than 50% respiratory variability, suggesting right atrial pressure of 3 mmHg.   EKG:  EKG is ordered today.  The EKG ordered today demonstrates NSR, 86 bpm, possible left atrial enlargement, cannot exclude possible prior inferior infarct, no acute ST-T changes, unchanged from prior  Recent Labs: 09/11/2019: BUN 24; Creatinine, Ser 1.79; Hemoglobin 12.1; Platelets 412; Potassium 4.3; Sodium 132  Recent Lipid Panel    Component Value Date/Time   CHOL 144 08/25/2019 0641   TRIG 75 08/25/2019 0641   HDL 55 08/25/2019 0641   CHOLHDL 2.6 08/25/2019 0641   VLDL 15 08/25/2019 0641   LDLCALC 74 08/25/2019 0641  PHYSICAL EXAM:    VS:  BP (!) 100/58 (BP Location: Left Arm, Patient Position: Sitting, Cuff Size: Normal)   Pulse 86   Ht 5\' 8"  (1.727 m)   Wt 157 lb 4 oz (71.3 kg)   BMI 23.91 kg/m   BMI: Body mass index is 23.91 kg/m.  Physical Exam  Constitutional: He is oriented to person, place, and time. He appears well-developed and well-nourished.  HENT:  Head: Normocephalic and atraumatic.  Eyes: Right eye exhibits no discharge. Left eye exhibits no discharge.  Neck: Normal range of motion. No JVD present.  Cardiovascular: Normal rate, regular rhythm, S1 normal, S2 normal and normal heart sounds. Exam reveals no distant heart sounds, no friction rub, no midsystolic click and no opening snap.  No murmur heard. Pulses:      Posterior tibial pulses are 2+ on the right side and 2+ on the left side.  Pulmonary/Chest: Effort normal and  breath sounds normal. No respiratory distress. He has no decreased breath sounds. He has no wheezes. He has no rales. He exhibits no tenderness.  Abdominal: Soft. He exhibits no distension. There is no abdominal tenderness.  Musculoskeletal:        General: No edema.  Neurological: He is alert and oriented to person, place, and time.  Skin: Skin is warm and dry. No cyanosis. Nails show no clubbing.  Psychiatric: He has a normal mood and affect. His speech is normal and behavior is normal. Judgment and thought content normal.    Wt Readings from Last 3 Encounters:  09/28/19 157 lb 4 oz (71.3 kg)  09/20/19 160 lb (72.6 kg)  09/19/19 161 lb (73 kg)     ASSESSMENT & PLAN:   1. Preoperative cardiac evaluation: He is doing well without any symptoms concerning for angina.  Per Revised Cardiac Index, he is low risk for noncardiac surgery.  No further cardiac testing is needed at this time and he may proceed without further cardiac work-up.  2. Dilated aortic arch: Incidentally noted on imaging in 08/2019 with radiology recommendation for semiannual follow-up CTA or MRI and referral to cardiothoracic surgery, which was stable on repeat imaging in mid 08/2019.  Optimal blood pressure, heart rate, and lipid control.  Avoid fluoroquinolones and heavy lifting/digging/shoveling.  Patient has established with cardiothoracic surgery at [facility], seeing them 1 week prior and will follow up as directed.  3. History of elevated high-sensitivity troponin: Felt to be in the setting of recent bronchitis during admission in 07/2017 with echo demonstrating preserved LV systolic function with normal wall motion.  No symptoms concerning for angina.  In this setting, we will defer outpatient ischemic testing at this time.  4. HTN: Blood pressure is well controlled today and slightly soft.  He has been taken off lisinopril in the setting of soft BP by [facility].  5. AKI: No longer on lisinopril.  Follow-up with PCP as directed.    Disposition: F/u with Dr. Rockey Situ or an APP in 6 months.   Medication Adjustments/Labs and Tests Ordered: Current medicines are reviewed at length with the patient today.  Concerns regarding medicines are outlined above. Medication changes, Labs and Tests ordered today are summarized above and listed in the Patient Instructions accessible in Encounters.   Signed, Christell Faith, PA-C 09/28/2019 8:36 AM     [facility] [address] [place] [place], [place] [address] [phone]

## 2019-09-26 NOTE — Telephone Encounter (Signed)
Pre-op team  Please make James Oconnor aware of the need for pre-op clearance and add this to appointment comments.   Thank you  Sharee Pimple

## 2019-09-28 ENCOUNTER — Other Ambulatory Visit: Payer: Self-pay

## 2019-09-28 ENCOUNTER — Encounter: Payer: Self-pay | Admitting: Physician Assistant

## 2019-09-28 ENCOUNTER — Ambulatory Visit (INDEPENDENT_AMBULATORY_CARE_PROVIDER_SITE_OTHER): Payer: Medicare HMO | Admitting: Physician Assistant

## 2019-09-28 VITALS — BP 100/58 | HR 86 | Ht 68.0 in | Wt 157.2 lb

## 2019-09-28 DIAGNOSIS — I10 Essential (primary) hypertension: Secondary | ICD-10-CM

## 2019-09-28 DIAGNOSIS — Q254 Congenital malformation of aorta unspecified: Secondary | ICD-10-CM

## 2019-09-28 DIAGNOSIS — I248 Other forms of acute ischemic heart disease: Secondary | ICD-10-CM | POA: Diagnosis not present

## 2019-09-28 DIAGNOSIS — Z01810 Encounter for preprocedural cardiovascular examination: Secondary | ICD-10-CM

## 2019-09-28 DIAGNOSIS — I2489 Other forms of acute ischemic heart disease: Secondary | ICD-10-CM

## 2019-09-28 NOTE — Patient Instructions (Signed)
Medication Instructions:  Your physician recommends that you continue on your current medications as directed. Please refer to the Current Medication list given to you today.  *If you need a refill on your cardiac medications before your next appointment, please call your pharmacy*  Lab Work: None ordered  If you have labs (blood work) drawn today and your tests are completely normal, you will receive your results only by: Marland Kitchen MyChart Message (if you have MyChart) OR . A paper copy in the mail If you have any lab test that is abnormal or we need to change your treatment, we will call you to review the results.  Testing/Procedures: None ordered   Follow-Up: At [facility], you and your health needs are our priority.  As part of our continuing mission to provide you with exceptional heart care, we have created designated Provider Care Teams.  These Care Teams include your primary Cardiologist (physician) and Advanced Practice Providers (APPs -  Physician Assistants and Nurse Practitioners) who all work together to provide you with the care you need, when you need it.  Your next appointment:   6 months  The format for your next appointment:   In Person  Provider:    You may see Ida Rogue, MD or one of the following Advanced Practice Providers on your designated Care Team:    Murray Hodgkins, NP  Christell Faith, PA-C  Marrianne Mood, PA-C   Other Instructions

## 2019-10-01 DIAGNOSIS — N2889 Other specified disorders of kidney and ureter: Secondary | ICD-10-CM | POA: Insufficient documentation

## 2019-10-01 NOTE — Progress Notes (Signed)
[name]  [phone]) [phone] Fax:(336) [phone]  ID: James Oconnor OB: [birth year]  MR#: [id number]  [id number]  Patient Care Team: Dion Body, MD as PCP - General (Family Medicine) Minna Merritts, MD as PCP - Cardiology (Cardiology)  CHIEF COMPLAINT: Left renal mass, weight loss.  INTERVAL HISTORY: Patient is an 85 year old male who was recently noted to have a left renal mass as well as unintentional weight loss.  He also has an elevated PSA and problems with urination which are being followed closely by urology.  He also complains of intermittent constipation.  He has no neurologic complaints.  He denies any recent fevers or illnesses.  He has a poor appetite.  He has no chest pain, shortness of breath, cough, or hemoptysis.  He denies any nausea, vomiting, or diarrhea.  Patient offers no further specific complaints today.    REVIEW OF SYSTEMS:   Review of Systems  Constitutional: Positive for weight loss. Negative for fever and malaise/fatigue.  Respiratory: Negative for cough and shortness of breath.   Cardiovascular: Negative.  Negative for chest pain and leg swelling.  Gastrointestinal: Positive for constipation. Negative for abdominal pain.  Genitourinary: Positive for frequency and urgency.  Musculoskeletal: Negative.  Negative for back pain.  Skin: Negative.  Negative for rash.  Neurological: Negative.  Negative for dizziness, focal weakness, weakness and headaches.  Psychiatric/Behavioral: Negative.  The patient is not nervous/anxious.     As per HPI. Otherwise, a complete review of systems is negative.  PAST MEDICAL HISTORY: Past Medical History:  Diagnosis Date   Diabetes mellitus without complication ([place])    Hypertension     PAST SURGICAL HISTORY: Past Surgical History:  Procedure Laterality Date   none      FAMILY HISTORY: Family History  Problem Relation Age of Onset   Lung cancer Father    Heart disease  Sister    Lung cancer Brother    Throat cancer Brother     ADVANCED DIRECTIVES (Y/N):  N  HEALTH MAINTENANCE: Social History   Tobacco Use   Smoking status: Former Smoker    Years: 60.00    Types: Cigarettes   Smokeless tobacco: Never Used   Tobacco comment: quit smoking 68yrs ago  Substance Use Topics   Alcohol use: Not Currently   Drug use: Not Currently     Colonoscopy:  PAP:  Bone density:  Lipid panel:  No Known Allergies  Current Outpatient Medications  Medication Sig Dispense Refill   finasteride (PROSCAR) 5 MG tablet Take 1 tablet (5 mg total) by mouth daily. (Patient not taking: Reported on 09/28/2019) 30 tablet 11   polyethylene glycol (MIRALAX / GLYCOLAX) 17 g packet Take 17 g by mouth daily. Mix one tablespoon with 8oz of your favorite juice or water every day until you are having soft formed stools. Then start taking once daily if you didn't have a stool the day before. (Patient not taking: Reported on 10/02/2019) 30 each 0   tadalafil (CIALIS) 5 MG tablet Take 1 tablet (5 mg total) by mouth as needed. (Patient not taking: Reported on 09/29/2019) 30 tablet 11   No current facility-administered medications for this visit.     OBJECTIVE: Vitals:   10/02/19 1333  BP: 102/87  Pulse: 74  Temp: 99 F (37.2 C)     Body mass index is 24.19 kg/m.    ECOG FS:1 - Symptomatic but completely ambulatory  General: Thin, no acute distress. Eyes: Pink conjunctiva, anicteric sclera. HEENT: Normocephalic, moist mucous  membranes, clear oropharnyx. Lungs: Clear to auscultation bilaterally. Heart: Regular rate and rhythm. No rubs, murmurs, or gallops. Abdomen: Soft, nontender, nondistended. No organomegaly noted, normoactive bowel sounds. Musculoskeletal: No edema, cyanosis, or clubbing. Neuro: Alert, answering all questions appropriately. Cranial nerves grossly intact. Skin: No rashes or petechiae noted. Psych: Normal affect. Lymphatics: No cervical,  calvicular, axillary or inguinal LAD.   LAB RESULTS:  Lab Results  Component Value Date   NA 136 10/02/2019   K 4.5 10/02/2019   CL 102 10/02/2019   CO2 22 10/02/2019   GLUCOSE 98 10/02/2019   BUN 19 10/02/2019   CREATININE 1.21 10/02/2019   CALCIUM 8.4 (L) 10/02/2019   PROT 7.6 10/02/2019   ALBUMIN 3.0 (L) 10/02/2019   AST 39 10/02/2019   ALT 33 10/02/2019   ALKPHOS 52 10/02/2019   BILITOT 0.4 10/02/2019   GFRNONAA 54 (L) 10/02/2019   GFRAA >60 10/02/2019    Lab Results  Component Value Date   WBC 9.3 10/02/2019   NEUTROABS 5.3 10/02/2019   HGB 11.6 (L) 10/02/2019   HCT 35.4 (L) 10/02/2019   MCV 81.0 10/02/2019   PLT 479 (H) 10/02/2019     STUDIES: Ct Abdomen Pelvis W Wo Contrast  Result Date: 09/11/2019 CLINICAL DATA:  Urinary retention and constipation for 2 days. Kidney lesion. Evaluate for bowel obstruction. EXAM: CT ABDOMEN AND PELVIS WITHOUT AND WITH CONTRAST TECHNIQUE: Multidetector CT imaging of the abdomen and pelvis was performed following the standard protocol before and following the bolus administration of intravenous contrast. CONTRAST:  <See Chart> OMNIPAQUE IOHEXOL 350 MG/ML SOLN, 55mL OMNIPAQUE IOHEXOL 300 MG/ML SOLN COMPARISON:  MRI 08/25/2019 FINDINGS: Lower chest: No acute abnormality. Bilateral lower lobe bronchiectasis, ground-glass attenuation and interstitial reticulation is identified concerning for chronic interstitial lung disease. Hepatobiliary: Large cyst identified within the dome of liver measures 8 cm. No suspicious enhancing liver abnormality. Gallbladder is unremarkable. No biliary ductal dilatation identified. Pancreas: Unremarkable. No pancreatic ductal dilatation or surrounding inflammatory changes. Spleen: Normal in size without focal abnormality. Adrenals/Urinary Tract: The adrenal glands are normal. Bilateral kidney stones are identified. The largest is in the inferior pole of the right kidney measuring 8 mm, image 68/11. Multiple  bilateral simple appearing kidney cysts are identified. The largest arises from the inferior pole of left kidney measuring 4.3 cm. Exophytic, solid enhancing lesion arising from upper pole of the left kidney measures 2.9 cm, image 46/6 and is compatible with a renal cell carcinoma. Mild bilateral hydronephrosis and hydroureter identified. There is diffuse distension of the urinary bladder with bladder wall trabeculation compatible with chronic bladder outlet obstruction. Stomach/Bowel: Stomach is nondistended. No small bowel wall thickening, inflammation or distension. There is mild distension of the colon to the level of the splenic flexure. A moderate stool burden is noted within the ascending colon and transverse colon. Normal caliber distal colon. Scattered distal colonic diverticula noted without acute inflammation. Vascular/Lymphatic: Aortic atherosclerosis. No aneurysm. No abdominopelvic adenopathy identified. Reproductive: Marked enlargement of the prostate gland measures 6.8 by 6.9 by 7.4 cm (volume = 180 cm^3). This has mass effect upon the bladder base in may be resulting in chronic bladder outlet obstruction. Other: No free fluid or fluid collections. Musculoskeletal: Thoracolumbar degenerative disc disease. No acute or suspicious osseous abnormality identified. IMPRESSION: 1. No evidence for high-grade bowel obstruction. A moderate stool burden is noted within the ascending colon and transverse colon which is mildly increased in caliber. 2. Marked enlargement of the prostate gland with secondary changes of chronic bladder outlet  obstruction. Secondary mild bilateral hydronephrosis and hydroureter noted. 3. Bilateral nephrolithiasis 4. Solid enhancing left kidney lesion compatible with renal cell carcinoma. 5. Bilateral kidney cysts. 6. Bronchiectasis, ground-glass attenuation and interstitial reticulation noted within the lung bases concerning for chronic interstitial lung disease. Electronically Signed    By: Kerby Moors M.D.   On: 09/11/2019 17:57   Ct Chest W Contrast  Result Date: 09/18/2019 CLINICAL DATA:  Follow-up mass effect on the upper esophagus due to a prominent aortic arch seen on a recent esophagram. EXAM: CT CHEST WITH CONTRAST TECHNIQUE: Multidetector CT imaging of the chest was performed during intravenous contrast administration. CONTRAST:  59mL OMNIPAQUE IOHEXOL 300 MG/ML  SOLN COMPARISON:  Esophagram and upper GI series dated 09/07/2019 and chest CTA dated 08/24/2019. FINDINGS: Cardiovascular: Again demonstrated is mild dilatation of the aortic arch, continuing measure 4.0 cm in maximum diameter. The ascending and descending thoracic aorta are normal in caliber. The heart is mildly enlarged with a decrease in minimal pericardial fluid with a minimal amount remaining, measuring 5 mm in maximum thickness. Atheromatous coronary artery calcifications. The left common carotid artery is arising from a common trunk with the innominate artery. Mediastinum/Nodes: No enlarged mediastinal, hilar, or axillary lymph nodes. Thyroid gland, trachea, and esophagus demonstrate no significant findings. Lungs/Pleura: Mild bilateral peripheral fibrosis is unchanged. Bilateral lower lobe and right middle lobe cylindrical bronchiectasis. Centrilobular bulla in the left lower lobe. No pleural fluid. Upper Abdomen: Exophytic, solid, mildly heterogeneously enhancing mass arising from the anterolateral aspect of the upper pole of the left kidney. This measures 2.4 x 2.3 cm on image number 149 series 2 s and 2.4 cm in length on coronal image number 87. Stable large cyst in the dome of the liver on the right. Bilateral renal cysts. Musculoskeletal: Thoracic and lower cervical spine degenerative changes. IMPRESSION: 1. 2.4 x 2.4 x 2.3 cm solid upper pole left renal mass, suspicious for a renal cell carcinoma. 2. 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 Recommend semi-annual  imaging followup by CTA or MRA and referral to cardiothoracic surgery if not already obtained. This recommendation follows 2010 ACCF/AHA/AATS/ACR/ASA/SCA/SCAI/SIR/STS/SVM Guidelines for the Diagnosis and Management of Patients With Thoracic Aortic Disease. Circulation. 2010; [id number]. Aortic aneurysm NOS (ICD10-I71.9) 3. Stable mild cardiomegaly with a decrease in minimal pericardial fluid. 4. Mild bilateral peripheral pulmonary fibrosis. 5. Bilateral lower lobe and right middle lobe cylindrical bronchiectasis. 6. Atheromatous coronary artery calcifications. These results will be called to the ordering clinician or representative by the Radiologist Assistant, and communication documented in the PACS or zVision Dashboard. Emphysema (ICD10-J43.9). Electronically Signed   By: Claudie Revering M.D.   On: 09/18/2019 08:50   Dg Abd 2 Views  Result Date: 09/11/2019 CLINICAL DATA:  Lower abdominal pain. Constipation. Barium swallow last week. EXAM: ABDOMEN - 2 VIEW COMPARISON:  MR abdomen without and with contrast 08/25/2019 FINDINGS: Fluid levels are present within the small bowel. Dilated loops are present in the right abdomen and left upper quadrant. There is no free air. No residual contrast is present in the bowel. Calcifications are present at the lower poles of both kidneys. IMPRESSION: 1. Fluid levels and distended loops of small bowel consistent with small bowel ileus. 2. No free air. 3. No residual bowel contrast from barium study last week. 4. Calcifications projected over the lower pole of the kidneys bilaterally. Electronically Signed   By: San Morelle M.D.   On: 09/11/2019 12:52   Dg [place] Floor Double  Cm (hd Ba)  Result Date: 09/07/2019 CLINICAL DATA:  Dysphagia EXAM: UPPER GI SERIES WITHOUT KUB ESOPHAGUS/BARIUM SWALLOW/TABLET STUDY TECHNIQUE: Routine esophagram and upper GI series was performed with thick and thin barium barium. FLUOROSCOPY TIME:  Fluoroscopy Time:  1 minutes 36 seconds  Radiation Exposure Index (if provided by the fluoroscopic device): 33.8 mGy Number of Acquired Spot Images: 37 COMPARISON:  CT chest 08/24/2019 FINDINGS: Decreased primary peristaltic wave with intermittent tertiary contractions. Esophagus is moderately distended. Mass effect on the upper esophagus secondary to a prominent aortic arch. No persisting stricture. No discrete mass or ulceration. Small hiatal hernia. The stomach distend normally without obvious mass, ulceration, or stricture. Duodenal bulb and duodenal sweep are normal in appearance and positioning without malrotation. No spontaneous gastroesophageal reflux. No extraluminal contrast. At the conclusion of the study, a 13 mm barium tablet was administered. This passed without delay into the stomach. IMPRESSION: 1. Mild esophageal dysmotility. 2. Mass effect on the upper esophagus secondary to prominent aortic arch. 3. No persisting stricture or ulcerations. 4. Small hiatal hernia. 5. Stomach and duodenum within normal limits. Electronically Signed   By: Davina Poke M.D.   On: 09/07/2019 11:05   Dg Esophagus W Double Cm (hd)  Result Date: 09/07/2019 CLINICAL DATA:  Dysphagia EXAM: UPPER GI SERIES WITHOUT KUB ESOPHAGUS/BARIUM SWALLOW/TABLET STUDY TECHNIQUE: Routine esophagram and upper GI series was performed with thick and thin barium barium. FLUOROSCOPY TIME:  Fluoroscopy Time:  1 minutes 36 seconds Radiation Exposure Index (if provided by the fluoroscopic device): 33.8 mGy Number of Acquired Spot Images: 37 COMPARISON:  CT chest 08/24/2019 FINDINGS: Decreased primary peristaltic wave with intermittent tertiary contractions. Esophagus is moderately distended. Mass effect on the upper esophagus secondary to a prominent aortic arch. No persisting stricture. No discrete mass or ulceration. Small hiatal hernia. The stomach distend normally without obvious mass, ulceration, or stricture. Duodenal bulb and duodenal sweep are normal in appearance and  positioning without malrotation. No spontaneous gastroesophageal reflux. No extraluminal contrast. At the conclusion of the study, a 13 mm barium tablet was administered. This passed without delay into the stomach. IMPRESSION: 1. Mild esophageal dysmotility. 2. Mass effect on the upper esophagus secondary to prominent aortic arch. 3. No persisting stricture or ulcerations. 4. Small hiatal hernia. 5. Stomach and duodenum within normal limits. Electronically Signed   By: Davina Poke M.D.   On: 09/07/2019 11:05    ASSESSMENT: Left renal mass, weight loss.  PLAN:   1. Left renal mass: CT scan results from September 11, 2019 reviewed independently and reported as above with a 2.9 cm enhancing lesion on the left kidney compatible with renal cell carcinoma.  Patient has been seen by urology and plan is simple observation. 2.  Weight loss: Likely multifactorial.  Abdominal imaging as above, patient also had a CT of the chest on September 18, 2019 that did not reveal any other significant pathology.  Patient continues to have difficulty urinating and is following up with urology for this.  He also has intermittent constipation which decreases his appetite.  Finally, patient also has some mild esophageal dysmotility.  I have ordered thyroid panel with TSH today for completeness.  No intervention is needed at this time. 3.  Constipation: Continue stool softeners daily and MiraLAX as needed. 4.  Urinary retention: Patient reports he has a procedure with urology in the next several weeks. 5.  Disposition: Return to clinic in 3 months with repeat laboratory can further evaluation.  Patient expressed understanding and was in  agreement with this plan. He also understands that He can call clinic at any time with any questions, concerns, or complaints.    Lloyd Huger, MD   10/02/2019 3:27 PM

## 2019-10-02 ENCOUNTER — Encounter: Payer: Self-pay | Admitting: Oncology

## 2019-10-02 ENCOUNTER — Inpatient Hospital Stay: Payer: Medicare HMO

## 2019-10-02 ENCOUNTER — Other Ambulatory Visit: Payer: Self-pay

## 2019-10-02 ENCOUNTER — Inpatient Hospital Stay: Payer: Medicare HMO | Attending: Oncology | Admitting: Oncology

## 2019-10-02 VITALS — BP 102/87 | HR 74 | Temp 99.0°F | Wt 159.1 lb

## 2019-10-02 DIAGNOSIS — K5900 Constipation, unspecified: Secondary | ICD-10-CM | POA: Diagnosis not present

## 2019-10-02 DIAGNOSIS — E119 Type 2 diabetes mellitus without complications: Secondary | ICD-10-CM | POA: Diagnosis not present

## 2019-10-02 DIAGNOSIS — R1030 Lower abdominal pain, unspecified: Secondary | ICD-10-CM | POA: Insufficient documentation

## 2019-10-02 DIAGNOSIS — R634 Abnormal weight loss: Secondary | ICD-10-CM | POA: Insufficient documentation

## 2019-10-02 DIAGNOSIS — K224 Dyskinesia of esophagus: Secondary | ICD-10-CM | POA: Diagnosis not present

## 2019-10-02 DIAGNOSIS — R338 Other retention of urine: Secondary | ICD-10-CM | POA: Diagnosis not present

## 2019-10-02 DIAGNOSIS — K449 Diaphragmatic hernia without obstruction or gangrene: Secondary | ICD-10-CM | POA: Diagnosis not present

## 2019-10-02 DIAGNOSIS — R630 Anorexia: Secondary | ICD-10-CM | POA: Diagnosis not present

## 2019-10-02 DIAGNOSIS — I10 Essential (primary) hypertension: Secondary | ICD-10-CM | POA: Diagnosis not present

## 2019-10-02 DIAGNOSIS — N2889 Other specified disorders of kidney and ureter: Secondary | ICD-10-CM | POA: Diagnosis present

## 2019-10-02 DIAGNOSIS — I712 Thoracic aortic aneurysm, without rupture: Secondary | ICD-10-CM | POA: Diagnosis not present

## 2019-10-02 LAB — CBC WITH DIFFERENTIAL/PLATELET
Abs Immature Granulocytes: 0.06 10*3/uL (ref 0.00–0.07)
Basophils Absolute: 0.1 10*3/uL (ref 0.0–0.1)
Basophils Relative: 1 %
Eosinophils Absolute: 0.2 10*3/uL (ref 0.0–0.5)
Eosinophils Relative: 3 %
HCT: 35.4 % — ABNORMAL LOW (ref 39.0–52.0)
Hemoglobin: 11.6 g/dL — ABNORMAL LOW (ref 13.0–17.0)
Immature Granulocytes: 1 %
Lymphocytes Relative: 31 %
Lymphs Abs: 2.9 10*3/uL (ref 0.7–4.0)
MCH: 26.5 pg (ref 26.0–34.0)
MCHC: 32.8 g/dL (ref 30.0–36.0)
MCV: 81 fL (ref 80.0–100.0)
Monocytes Absolute: 0.7 10*3/uL (ref 0.1–1.0)
Monocytes Relative: 8 %
Neutro Abs: 5.3 10*3/uL (ref 1.7–7.7)
Neutrophils Relative %: 56 %
Platelets: 479 10*3/uL — ABNORMAL HIGH (ref 150–400)
RBC: 4.37 MIL/uL (ref 4.22–5.81)
RDW: 14 % (ref 11.5–15.5)
WBC: 9.3 10*3/uL (ref 4.0–10.5)
nRBC: 0 % (ref 0.0–0.2)

## 2019-10-02 LAB — COMPREHENSIVE METABOLIC PANEL
ALT: 33 U/L (ref 0–44)
AST: 39 U/L (ref 15–41)
Albumin: 3 g/dL — ABNORMAL LOW (ref 3.5–5.0)
Alkaline Phosphatase: 52 U/L (ref 38–126)
Anion gap: 12 (ref 5–15)
BUN: 19 mg/dL (ref 8–23)
CO2: 22 mmol/L (ref 22–32)
Calcium: 8.4 mg/dL — ABNORMAL LOW (ref 8.9–10.3)
Chloride: 102 mmol/L (ref 98–111)
Creatinine, Ser: 1.21 mg/dL (ref 0.61–1.24)
GFR calc Af Amer: >60 mL/min (ref >60)
GFR calc non Af Amer: 54 mL/min — ABNORMAL LOW (ref >60)
Glucose, Bld: 98 mg/dL (ref 70–99)
Potassium: 4.5 mmol/L (ref 3.5–5.1)
Sodium: 136 mmol/L (ref 135–145)
Total Bilirubin: 0.4 mg/dL (ref 0.3–1.2)
Total Protein: 7.6 g/dL (ref 6.5–8.1)

## 2019-10-02 LAB — IRON AND TIBC
Iron: 33 ug/dL — ABNORMAL LOW (ref 45–182)
Saturation Ratios: 14 % — ABNORMAL LOW (ref 17.9–39.5)
TIBC: 230 ug/dL — ABNORMAL LOW (ref 250–450)
UIBC: 197 ug/dL

## 2019-10-02 LAB — FERRITIN: Ferritin: 373 ng/mL — ABNORMAL HIGH (ref 24–336)

## 2019-10-02 NOTE — Progress Notes (Signed)
10/03/2019 11:14 AM   [name] May 04, 1930 [id number]  Referring provider: Dion Body, MD [facility] [place],  [place] [address]  Chief Complaint  Patient presents with  . Pre-op Exam    HPI: Mr. Zenk is an 85 year old male with BPH with retention, history of elevated PSA and RCC who presents today for pre-op preparation for HoLEP.  He had a Foley placed on 09/20/2019 for urinary retention.  He has a positive urine culture for Klebsiella pneumoniae and has completed a round of Septra DS.    Today, he feels well.  The urine in the leg bag is clear yellow.  Patient denies any gross hematuria or suprapubic/flank pain.  Patient denies any fevers, chills, nausea or vomiting.   He has been seen by cardiology on 09/28/2019 and is deemed a low risk for noncardiac surgery.   PMH: Past Medical History:  Diagnosis Date  . Diabetes mellitus without complication ([place])   . Hypertension     Surgical History: Past Surgical History:  Procedure Laterality Date  . none      Home Medications:  Allergies as of 10/03/2019   No Known Allergies     Medication List       Accurate as of October 03, 2019 11:14 AM. If you have any questions, ask your nurse or doctor.        STOP taking these medications   finasteride 5 MG tablet Commonly known as: PROSCAR Stopped by: Akeem Heppler, PA-C   polyethylene glycol 17 g packet Commonly known as: MIRALAX / GLYCOLAX Stopped by: Ryla Cauthon, PA-C   tadalafil 5 MG tablet Commonly known as: CIALIS Stopped by: Zara Council, PA-C       Allergies: No Known Allergies  Family History: Family History  Problem Relation Age of Onset  . Lung cancer Father   . Heart disease Sister   . Lung cancer Brother   . Throat cancer Brother     Social History:  reports that he has quit smoking. His smoking use included cigarettes. He quit after 60.00 years of use. He has never used smokeless  tobacco. He reports previous alcohol use. He reports previous drug use.  ROS: UROLOGY Frequent Urination?: Yes Hard to postpone urination?: Yes Burning/pain with urination?: Yes Get up at night to urinate?: Yes Leakage of urine?: Yes Urine stream starts and stops?: Yes Trouble starting stream?: Yes Do you have to strain to urinate?: Yes Blood in urine?: Yes Urinary tract infection?: No Sexually transmitted disease?: No Injury to kidneys or bladder?: No Painful intercourse?: No Weak stream?: Yes Erection problems?: No Penile pain?: No  Gastrointestinal Nausea?: No Vomiting?: No Indigestion/heartburn?: No Diarrhea?: No Constipation?: No  Constitutional Fever: No Night sweats?: No Weight loss?: Yes Fatigue?: No  Skin Skin rash/lesions?: No Itching?: No  Eyes Blurred vision?: No Double vision?: No  Ears/Nose/Throat Sore throat?: No Sinus problems?: No  Hematologic/Lymphatic Swollen glands?: No Easy bruising?: No  Cardiovascular Leg swelling?: No Chest pain?: Yes  Respiratory Cough?: Yes Shortness of breath?: Yes  Endocrine Excessive thirst?: No  Musculoskeletal Back pain?: No Joint pain?: Yes  Neurological Headaches?: No Dizziness?: No  Psychologic Depression?: No Anxiety?: No  Physical Exam: BP 98/61   Pulse (!) 105   Ht 5\' 7"  (1.702 m)   Wt 159 lb (72.1 kg)   BMI 24.90 kg/m   Constitutional:  Well nourished. Alert and oriented, No acute distress. HEENT: [place] AT, moist mucus membranes.  Trachea midline,  no masses. Cardiovascular: No clubbing, cyanosis, or edema. Respiratory: Normal respiratory effort, no increased work of breathing. GI: Abdomen is soft, non tender, non distended, no abdominal masses. Liver and spleen not palpable.  No hernias appreciated.  Stool sample for occult testing is not indicated.   GU: No CVA tenderness.  No bladder fullness or masses.  Patient with uncircumcised phallus. Foreskin easily retracted.  Urethral  meatus is patent.  No penile discharge. No penile lesions or rashes. Scrotum without lesions, cysts, rashes and/or edema.  Testicles are located scrotally bilaterally.  Neurologic: Grossly intact, no focal deficits, moving all 4 extremities. Psychiatric: Normal mood and affect.  Laboratory Data: Lab Results  Component Value Date   WBC 9.3 10/02/2019   HGB 11.6 (L) 10/02/2019   HCT 35.4 (L) 10/02/2019   MCV 81.0 10/02/2019   PLT 479 (H) 10/02/2019    Lab Results  Component Value Date   CREATININE 1.21 10/02/2019    No results found for: PSA  No results found for: TESTOSTERONE  No results found for: HGBA1C  Lab Results  Component Value Date   TSH 1.760 10/02/2019       Component Value Date/Time   CHOL 144 08/25/2019 0641   HDL 55 08/25/2019 0641   CHOLHDL 2.6 08/25/2019 0641   VLDL 15 08/25/2019 0641   LDLCALC 74 08/25/2019 0641    Lab Results  Component Value Date   AST 39 10/02/2019   Lab Results  Component Value Date   ALT 33 10/02/2019   No components found for: ALKALINEPHOPHATASE No components found for: BILIRUBINTOTAL  No results found for: ESTRADIOL  Urinalysis    Component Value Date/Time   COLORURINE YELLOW (A) 09/11/2019 1803   APPEARANCEUR Cloudy (A) 09/20/2019 1302   LABSPEC 1.004 (L) 09/11/2019 1803   PHURINE 6.0 09/11/2019 1803   GLUCOSEU Trace (A) 09/20/2019 1302   HGBUR SMALL (A) 09/11/2019 1803   BILIRUBINUR Negative 09/20/2019 1302   KETONESUR NEGATIVE 09/11/2019 1803   PROTEINUR 3+ (A) 09/20/2019 1302   PROTEINUR NEGATIVE 09/11/2019 1803   NITRITE Positive (A) 09/20/2019 1302   NITRITE NEGATIVE 09/11/2019 1803   LEUKOCYTESUR 3+ (A) 09/20/2019 1302   LEUKOCYTESUR NEGATIVE 09/11/2019 1803    I have reviewed the labs.  Procedure Cath Change/ Replacement Patient is present today for a catheter change due to urinary retention.  10 ml of water was removed from the balloon, an 18 FR Coude cath was removed with out difficulty.   Patient was cleaned and prepped in a sterile fashion with betadine and 2% lidocaine jelly was instilled into the urethra. A 18 FR foley cath was replaced into the bladder no complications were noted Urine return was noted 50 ml and urine was yellow in color. The balloon was filled with 62ml of sterile water. A leg bag was attached for drainage.  A night bag was also given to the patient and patient was given instruction on how to change from one bag to another. Patient was given proper instruction on catheter care.    Performed by: Zara Council, PA-C   Assessment & Plan:    1. Urinary retention  Foley exchanged today - schedule for HoLEP on 10/13/2019   2. BPH with obstruction/lower urinary tract symptoms - now with retention - HoLEP pending  - Urinalysis, Complete - CULTURE, URINE COMPREHENSIVE  3. History of elevated PSA Prostate tissue will be sent to pathology at the time of HoLEP procedure  4. RCC 3 cm Left renal mass concerning for  renal cell carcinoma pursing active surveillance - MRI in 6 months    Return for HoLEP scheduled for 10/13/2019 .  These notes generated with voice recognition software. I apologize for typographical errors.  Zara Council, PA-C  [facility] [address]  [place], [place] [address] [phone]

## 2019-10-02 NOTE — Progress Notes (Signed)
Pt reports weight loss of 30 pounds since January. Loss of appetite, no nausea, no diarrhea, but reports a lot of constipation.

## 2019-10-03 ENCOUNTER — Ambulatory Visit (INDEPENDENT_AMBULATORY_CARE_PROVIDER_SITE_OTHER): Payer: Medicare HMO | Admitting: Urology

## 2019-10-03 ENCOUNTER — Encounter: Payer: Self-pay | Admitting: Urology

## 2019-10-03 ENCOUNTER — Ambulatory Visit: Payer: Medicare HMO

## 2019-10-03 VITALS — BP 98/61 | HR 105 | Ht 67.0 in | Wt 159.0 lb

## 2019-10-03 DIAGNOSIS — N138 Other obstructive and reflux uropathy: Secondary | ICD-10-CM

## 2019-10-03 DIAGNOSIS — R339 Retention of urine, unspecified: Secondary | ICD-10-CM | POA: Diagnosis not present

## 2019-10-03 DIAGNOSIS — C642 Malignant neoplasm of left kidney, except renal pelvis: Secondary | ICD-10-CM

## 2019-10-03 DIAGNOSIS — Z87898 Personal history of other specified conditions: Secondary | ICD-10-CM

## 2019-10-03 DIAGNOSIS — N401 Enlarged prostate with lower urinary tract symptoms: Secondary | ICD-10-CM | POA: Diagnosis not present

## 2019-10-03 LAB — THYROID PANEL WITH TSH
Free Thyroxine Index: 1.7 (ref 1.2–4.9)
T3 Uptake Ratio: 29 % (ref 24–39)
T4, Total: 5.7 ug/dL (ref 4.5–12.0)
TSH: 1.76 u[IU]/mL (ref 0.450–4.500)

## 2019-10-03 LAB — URINALYSIS, COMPLETE
Bilirubin, UA: NEGATIVE
Glucose, UA: NEGATIVE
Ketones, UA: NEGATIVE
Nitrite, UA: POSITIVE — AB
Specific Gravity, UA: 1.02 (ref 1.005–1.030)
Urobilinogen, Ur: 0.2 mg/dL (ref 0.2–1.0)
pH, UA: 6 (ref 5.0–7.5)

## 2019-10-03 LAB — MICROSCOPIC EXAMINATION
Epithelial Cells (non renal): NONE SEEN /hpf (ref 0–10)
RBC, Urine: >30 /hpf — AB (ref 0–2)
WBC, UA: >30 /hpf — AB (ref 0–5)

## 2019-10-07 LAB — CULTURE, URINE COMPREHENSIVE

## 2019-10-08 ENCOUNTER — Other Ambulatory Visit: Payer: Self-pay | Admitting: Urology

## 2019-10-08 MED ORDER — AMOXICILLIN-POT CLAVULANATE 875-125 MG PO TABS: 1.0000 | ORAL_TABLET | Freq: Two times a day (BID) | ORAL | 0 refills | Status: DC

## 2019-10-09 ENCOUNTER — Encounter: Payer: Self-pay | Admitting: Urology

## 2019-10-09 ENCOUNTER — Ambulatory Visit (INDEPENDENT_AMBULATORY_CARE_PROVIDER_SITE_OTHER): Payer: Medicare HMO | Admitting: Urology

## 2019-10-09 ENCOUNTER — Encounter
Admission: RE | Admit: 2019-10-09 | Discharge: 2019-10-09 | Disposition: A | Discharge status: Home / Self Care | Payer: Medicare HMO | Source: Ambulatory Visit | Attending: Urology | Admitting: Urology

## 2019-10-09 ENCOUNTER — Other Ambulatory Visit: Payer: Self-pay

## 2019-10-09 VITALS — BP 114/73 | HR 94 | Ht 68.0 in | Wt 160.1 lb

## 2019-10-09 DIAGNOSIS — R339 Retention of urine, unspecified: Secondary | ICD-10-CM | POA: Diagnosis not present

## 2019-10-09 DIAGNOSIS — Z01812 Encounter for preprocedural laboratory examination: Secondary | ICD-10-CM | POA: Insufficient documentation

## 2019-10-09 HISTORY — DX: Benign prostatic hyperplasia without lower urinary tract symptoms: N40.0

## 2019-10-09 HISTORY — DX: Urinary tract infection, site not specified: N39.0

## 2019-10-09 NOTE — Patient Instructions (Signed)
Your procedure is scheduled on: 11/13/2020Fri Report to Same Day Surgery 2nd floor medical mall [facility] Entrance-take elevator on left to 2nd floor.  Check in with surgery information desk.) To find out your arrival time please call [phone] between 1PM - 3PM on 10/12/2019 Thurs  Remember: Instructions that are not followed completely may result in serious medical risk, up to and including death, or upon the discretion of your surgeon and anesthesiologist your surgery may need to be rescheduled.    _x___ 1. Do not eat food after midnight the night before your procedure. You may drink clear liquids up to 2 hours before you are scheduled to arrive at the hospital for your procedure.  Do not drink clear liquids within 2 hours of your scheduled arrival to the hospital.  Clear liquids include  --Water or Apple juice without pulp  --Clear carbohydrate beverage such as ClearFast or Gatorade  --Black Coffee or Clear Tea (No milk, no creamers, do not add anything to                  the coffee or Tea Type 1 and type 2 diabetics should only drink water.   ____Ensure clear carbohydrate drink on the way to the hospital for bariatric patients  ____Ensure clear carbohydrate drink 3 hours before surgery.   No gum chewing or hard candies.     __x__ 2. No Alcohol for 24 hours before or after surgery.   __x__3. No Smoking or e-cigarettes for 24 prior to surgery.  Do not use any chewable tobacco products for at least 6 hour prior to surgery   ____  4. Bring all medications with you on the day of surgery if instructed.    __x__ 5. Notify your doctor if there is any change in your medical condition     (cold, fever, infections).    x___6. On the morning of surgery brush your teeth with toothpaste and water.  You may rinse your mouth with mouth wash if you wish.  Do not swallow any toothpaste or mouthwash.   Do not wear jewelry, make-up, hairpins, clips or nail polish.  Do not wear lotions,  powders, or perfumes. You may wear deodorant.  Do not shave 48 hours prior to surgery. Men may shave face and neck.  Do not bring valuables to the hospital.    [facility] is not responsible for any belongings or valuables.               Contacts, dentures or bridgework may not be worn into surgery.  Leave your suitcase in the car. After surgery it may be brought to your room.  For patients admitted to the hospital, discharge time is determined by your                       treatment team.  _  Patients discharged the day of surgery will not be allowed to drive home.  You will need someone to drive you home and stay with you the night of your procedure.    Please read over the following fact sheets that you were given:   [facility] Preparing for Surgery and or MRSA Information   _x___ Take anti-hypertensive listed below, cardiac, seizure, asthma,     anti-reflux and psychiatric medicines. These include:  1. None  2.  3.  4.  5.  6.  ____Fleets enema or Magnesium Citrate as directed.   _x___ Use CHG Soap or sage  wipes as directed on instruction sheet   ____ Use inhalers on the day of surgery and bring to hospital day of surgery  ____ Stop Metformin and Janumet 2 days prior to surgery.    ____ Take 1/2 of usual insulin dose the night before surgery and none on the morning     surgery.   _x___ Follow recommendations from Cardiologist, Pulmonologist or PCP regarding          stopping Aspirin, Coumadin, Plavix ,Eliquis, Effient, or Pradaxa, and Pletal.  X____Stop Anti-inflammatories such as Advil, Aleve, Ibuprofen, Motrin, Naproxen, Naprosyn, Goodies powders or aspirin products. OK to take Tylenol and                          Celebrex.   _x___ Stop supplements until after surgery.  But may continue Vitamin D, Vitamin B,       and multivitamin.   ____ Bring C-Pap to the hospital.

## 2019-10-09 NOTE — Progress Notes (Addendum)
10/09/2019 3:40 PM   James Oconnor 1930-11-12 [id number]  Referring provider: Dion Body, MD [facility] [place],  [place] [address]  Chief Complaint  Patient presents with  . Urinary Retention    HPI: James Oconnor is an 85 year old male with BPH with retention, history of elevated PSA and RCC who presents today for difficulty with Foley.  He had a Foley placed on 09/20/2019 for urinary retention.  He has a positive urine culture for Klebsiella pneumoniae and has completed a round of Septra DS.    It was exchanged on October 03, 2019 and he was initiated on Augmentin on October 08, 2019 due to a positive urine culture for E. Coli.  He states that yesterday he noticed it was not draining and he readjusted the Foley catheter tubing and it started draining again.  He denied any blood or clots or sediment in the catheter or leg bag.  Today, the catheter is draining clear yellow urine.  Patient denies any gross hematuria, dysuria or suprapubic/flank pain.  Patient denies any fevers, chills, nausea or vomiting.   He has started the Augmentin.  He has been seen by cardiology on 09/28/2019 and is deemed a low risk for noncardiac surgery.   PMH: Past Medical History:  Diagnosis Date  . Diabetes mellitus without complication ([place])   . Hypertension   . Prostate enlargement   . UTI (urinary tract infection)     Surgical History: Past Surgical History:  Procedure Laterality Date  . NO PAST SURGERIES    . none      Home Medications:  Allergies as of 10/09/2019   No Known Allergies     Medication List       Accurate as of October 09, 2019  3:40 PM. If you have any questions, ask your nurse or doctor.        amoxicillin-clavulanate 875-125 MG tablet Commonly known as: AUGMENTIN Take 1 tablet by mouth every 12 (twelve) hours.   lisinopril 10 MG tablet Commonly known as: ZESTRIL Take 10 mg by mouth daily.       Allergies: No Known  Allergies  Family History: Family History  Problem Relation Age of Onset  . Lung cancer Father   . Heart disease Sister   . Lung cancer Brother   . Throat cancer Brother     Social History:  reports that he has quit smoking. His smoking use included cigarettes. He quit after 60.00 years of use. He has never used smokeless tobacco. He reports previous alcohol use. He reports previous drug use.  ROS: UROLOGY Frequent Urination?: No Hard to postpone urination?: No Burning/pain with urination?: No Get up at night to urinate?: No Leakage of urine?: No Urine stream starts and stops?: No Trouble starting stream?: No Do you have to strain to urinate?: No Blood in urine?: No Urinary tract infection?: No Sexually transmitted disease?: No Injury to kidneys or bladder?: No Painful intercourse?: No Weak stream?: No Erection problems?: No Penile pain?: No  Gastrointestinal Nausea?: No Vomiting?: No Indigestion/heartburn?: No Diarrhea?: No Constipation?: No  Constitutional Night sweats?: No Weight loss?: No Fatigue?: No  Skin Skin rash/lesions?: No Itching?: No  Eyes Blurred vision?: No Double vision?: No  Ears/Nose/Throat Sore throat?: No Sinus problems?: No  Hematologic/Lymphatic Swollen glands?: No Easy bruising?: No  Cardiovascular Leg swelling?: No Chest pain?: No  Respiratory Cough?: No Shortness of breath?: No  Endocrine Excessive thirst?: No  Musculoskeletal Back pain?: No Joint  pain?: No  Neurological Headaches?: No Dizziness?: No  Psychologic Depression?: No Anxiety?: No  Physical Exam: BP 114/73   Pulse 94   Ht 5\' 8"  (1.727 m)   Wt 160 lb 1.6 oz (72.6 kg)   BMI 24.34 kg/m   Constitutional:  Well nourished. Alert and oriented, No acute distress. HEENT: [place] AT, moist mucus membranes.  Trachea midline, no masses. Cardiovascular: No clubbing, cyanosis, or edema. Respiratory: Normal respiratory effort, no increased work of breathing.  GI: Abdomen is soft, non tender, non distended, no abdominal masses. Liver and spleen not palpable.  No hernias appreciated.  Stool sample for occult testing is not indicated.   GU: No CVA tenderness.  No bladder fullness or masses.  Patient with uncircumcised phallus.  Foreskin easily retracted.  Foley in place draining clear yellow urine.  Scrotum without lesions, cysts, rashes and/or edema.   Neurologic: Grossly intact, no focal deficits, moving all 4 extremities. Psychiatric: Normal mood and affect.  Laboratory Data: Lab Results  Component Value Date   WBC 9.3 10/02/2019   HGB 11.6 (L) 10/02/2019   HCT 35.4 (L) 10/02/2019   MCV 81.0 10/02/2019   PLT 479 (H) 10/02/2019    Lab Results  Component Value Date   CREATININE 1.21 10/02/2019    No results found for: PSA  No results found for: TESTOSTERONE  No results found for: HGBA1C  Lab Results  Component Value Date   TSH 1.760 10/02/2019       Component Value Date/Time   CHOL 144 08/25/2019 0641   HDL 55 08/25/2019 0641   CHOLHDL 2.6 08/25/2019 0641   VLDL 15 08/25/2019 0641   LDLCALC 74 08/25/2019 0641    Lab Results  Component Value Date   AST 39 10/02/2019   Lab Results  Component Value Date   ALT 33 10/02/2019   No components found for: ALKALINEPHOPHATASE No components found for: BILIRUBINTOTAL  No results found for: ESTRADIOL  Urinalysis    Component Value Date/Time   COLORURINE YELLOW (A) 09/11/2019 1803   APPEARANCEUR Cloudy (A) 10/03/2019 1104   LABSPEC 1.004 (L) 09/11/2019 1803   PHURINE 6.0 09/11/2019 1803   GLUCOSEU Negative 10/03/2019 1104   HGBUR SMALL (A) 09/11/2019 1803   BILIRUBINUR Negative 10/03/2019 1104   KETONESUR NEGATIVE 09/11/2019 1803   PROTEINUR 2+ (A) 10/03/2019 1104   PROTEINUR NEGATIVE 09/11/2019 1803   NITRITE Positive (A) 10/03/2019 1104   NITRITE NEGATIVE 09/11/2019 1803   LEUKOCYTESUR 3+ (A) 10/03/2019 1104   LEUKOCYTESUR NEGATIVE 09/11/2019 1803    I have  reviewed the labs.  Procedure I let down the Foley balloon and the catheter and advanced the catheter to the hub and filled the balloon with 20 cc of sterile water.  I then irrigated the catheter easily with a return of pale yellow urine.    Performed by: Zara Council, PA-C   Assessment & Plan:    1. Urinary retention  Schedule for HoLEP on 10/13/2019  2. BPH with obstruction/lower urinary tract symptoms - now with retention - HoLEP pending   3. History of elevated PSA Prostate tissue will be sent to pathology at the time of HoLEP procedure  4. RCC 3 cm Left renal mass concerning for renal cell carcinoma pursing active surveillance - MRI in 6 months    Return for keep HoLEP appointment .  These notes generated with voice recognition software. I apologize for typographical errors.  Zara Council, [facility] [address]  [address]  [place], [place] [address] [phone]

## 2019-10-10 ENCOUNTER — Other Ambulatory Visit
Admission: RE | Admit: 2019-10-10 | Discharge: 2019-10-10 | Disposition: A | Discharge status: Home / Self Care | Payer: Medicare HMO | Source: Ambulatory Visit | Attending: Urology | Admitting: Urology

## 2019-10-10 DIAGNOSIS — Z20828 Contact with and (suspected) exposure to other viral communicable diseases: Secondary | ICD-10-CM | POA: Diagnosis not present

## 2019-10-10 DIAGNOSIS — Z01812 Encounter for preprocedural laboratory examination: Secondary | ICD-10-CM | POA: Diagnosis present

## 2019-10-10 LAB — SARS CORONAVIRUS 2 (TAT 6-24 HRS): SARS Coronavirus 2: NEGATIVE

## 2019-10-12 ENCOUNTER — Other Ambulatory Visit: Payer: Medicare HMO

## 2019-10-13 ENCOUNTER — Ambulatory Visit
Admission: RE | Admit: 2019-10-13 | Discharge: 2019-10-13 | Disposition: A | Discharge status: Home / Self Care | Payer: Medicare HMO | Source: Ambulatory Visit | Attending: Urology | Admitting: Urology

## 2019-10-13 ENCOUNTER — Encounter: Payer: Self-pay | Admitting: *Deleted

## 2019-10-13 ENCOUNTER — Encounter
Admission: RE | Disposition: A | Discharge status: Home / Self Care | Payer: Self-pay | Source: Ambulatory Visit | Attending: Urology

## 2019-10-13 ENCOUNTER — Other Ambulatory Visit: Payer: Self-pay

## 2019-10-13 ENCOUNTER — Ambulatory Visit: Payer: Medicare HMO | Admitting: Certified Registered Nurse Anesthetist

## 2019-10-13 DIAGNOSIS — R3914 Feeling of incomplete bladder emptying: Secondary | ICD-10-CM | POA: Diagnosis not present

## 2019-10-13 DIAGNOSIS — E119 Type 2 diabetes mellitus without complications: Secondary | ICD-10-CM | POA: Insufficient documentation

## 2019-10-13 DIAGNOSIS — Z87891 Personal history of nicotine dependence: Secondary | ICD-10-CM | POA: Insufficient documentation

## 2019-10-13 DIAGNOSIS — I10 Essential (primary) hypertension: Secondary | ICD-10-CM | POA: Insufficient documentation

## 2019-10-13 DIAGNOSIS — N3289 Other specified disorders of bladder: Secondary | ICD-10-CM | POA: Diagnosis not present

## 2019-10-13 DIAGNOSIS — R3912 Poor urinary stream: Secondary | ICD-10-CM | POA: Insufficient documentation

## 2019-10-13 DIAGNOSIS — R3916 Straining to void: Secondary | ICD-10-CM | POA: Diagnosis not present

## 2019-10-13 DIAGNOSIS — Z87440 Personal history of urinary (tract) infections: Secondary | ICD-10-CM | POA: Diagnosis not present

## 2019-10-13 DIAGNOSIS — N401 Enlarged prostate with lower urinary tract symptoms: Secondary | ICD-10-CM | POA: Insufficient documentation

## 2019-10-13 DIAGNOSIS — Z79899 Other long term (current) drug therapy: Secondary | ICD-10-CM | POA: Diagnosis not present

## 2019-10-13 DIAGNOSIS — N138 Other obstructive and reflux uropathy: Secondary | ICD-10-CM

## 2019-10-13 DIAGNOSIS — R338 Other retention of urine: Secondary | ICD-10-CM | POA: Insufficient documentation

## 2019-10-13 DIAGNOSIS — R339 Retention of urine, unspecified: Secondary | ICD-10-CM

## 2019-10-13 HISTORY — PX: HOLEP-LASER ENUCLEATION OF THE PROSTATE WITH MORCELLATION: SHX6641

## 2019-10-13 LAB — GLUCOSE, CAPILLARY
Glucose-Capillary: 86 mg/dL (ref 70–99)
Glucose-Capillary: 97 mg/dL (ref 70–99)

## 2019-10-13 SURGERY — ENUCLEATION, PROSTATE, USING LASER, WITH MORCELLATION
Anesthesia: General | Site: Prostate

## 2019-10-13 MED ORDER — PROPOFOL 10 MG/ML IV BOLUS
INTRAVENOUS | Status: AC
  Filled 2019-10-13: qty 20

## 2019-10-13 MED ORDER — FENTANYL CITRATE (PF) 100 MCG/2ML IJ SOLN
INTRAMUSCULAR | Status: AC
  Filled 2019-10-13: qty 2

## 2019-10-13 MED ORDER — BELLADONNA ALKALOIDS-OPIUM 16.2-60 MG RE SUPP
RECTAL | Status: DC | PRN
  Administered 2019-10-13: 1 via RECTAL

## 2019-10-13 MED ORDER — ROCURONIUM BROMIDE 100 MG/10ML IV SOLN
INTRAVENOUS | Status: DC | PRN
  Administered 2019-10-13: 50 mg via INTRAVENOUS

## 2019-10-13 MED ORDER — CEFAZOLIN SODIUM-DEXTROSE 2-4 GM/100ML-% IV SOLN
INTRAVENOUS | Status: AC
  Filled 2019-10-13: qty 100

## 2019-10-13 MED ORDER — FENTANYL CITRATE (PF) 100 MCG/2ML IJ SOLN: 25.0000 ug | INTRAMUSCULAR | Status: DC | PRN

## 2019-10-13 MED ORDER — ONDANSETRON HCL 4 MG/2ML IJ SOLN
INTRAMUSCULAR | Status: DC | PRN
  Administered 2019-10-13: 4 mg via INTRAVENOUS

## 2019-10-13 MED ORDER — SUGAMMADEX SODIUM 200 MG/2ML IV SOLN
INTRAVENOUS | Status: DC | PRN
  Administered 2019-10-13: 150 mg via INTRAVENOUS
  Administered 2019-10-13: 50 mg via INTRAVENOUS

## 2019-10-13 MED ORDER — ONDANSETRON HCL 4 MG/2ML IJ SOLN
INTRAMUSCULAR | Status: AC
  Filled 2019-10-13: qty 2

## 2019-10-13 MED ORDER — DEXAMETHASONE SODIUM PHOSPHATE 10 MG/ML IJ SOLN
INTRAMUSCULAR | Status: AC
  Filled 2019-10-13: qty 1

## 2019-10-13 MED ORDER — FENTANYL CITRATE (PF) 250 MCG/5ML IJ SOLN
INTRAMUSCULAR | Status: DC | PRN
  Administered 2019-10-13 (×2): 50 ug via INTRAVENOUS
  Administered 2019-10-13 (×2): 25 ug via INTRAVENOUS
  Administered 2019-10-13 (×2): 50 ug via INTRAVENOUS

## 2019-10-13 MED ORDER — PHENYLEPHRINE HCL (PRESSORS) 10 MG/ML IV SOLN
INTRAVENOUS | Status: DC | PRN
  Administered 2019-10-13: 100 ug via INTRAVENOUS
  Administered 2019-10-13: 200 ug via INTRAVENOUS

## 2019-10-13 MED ORDER — PROPOFOL 10 MG/ML IV BOLUS
INTRAVENOUS | Status: DC | PRN
  Administered 2019-10-13: 110 mg via INTRAVENOUS
  Administered 2019-10-13: 30 mg via INTRAVENOUS

## 2019-10-13 MED ORDER — FAMOTIDINE 20 MG PO TABS
ORAL_TABLET | ORAL | Status: AC
  Administered 2019-10-13: 20 mg via ORAL
  Filled 2019-10-13: qty 1

## 2019-10-13 MED ORDER — LIDOCAINE HCL 4 % MT SOLN
OROMUCOSAL | Status: DC | PRN
  Administered 2019-10-13: 4 mL via TOPICAL

## 2019-10-13 MED ORDER — HYDROCODONE-ACETAMINOPHEN 5-325 MG PO TABS: 1.0000 | ORAL_TABLET | ORAL | 0 refills | Status: AC | PRN

## 2019-10-13 MED ORDER — ROCURONIUM BROMIDE 50 MG/5ML IV SOLN
INTRAVENOUS | Status: AC
  Filled 2019-10-13: qty 1

## 2019-10-13 MED ORDER — LIDOCAINE HCL (PF) 2 % IJ SOLN
INTRAMUSCULAR | Status: AC
  Filled 2019-10-13: qty 10

## 2019-10-13 MED ORDER — FAMOTIDINE 20 MG PO TABS
20.0000 mg | ORAL_TABLET | Freq: Once | ORAL | Status: AC
  Administered 2019-10-13: 07:00:00 20 mg via ORAL

## 2019-10-13 MED ORDER — DEXAMETHASONE SODIUM PHOSPHATE 10 MG/ML IJ SOLN
INTRAMUSCULAR | Status: DC | PRN
  Administered 2019-10-13: 10 mg via INTRAVENOUS

## 2019-10-13 MED ORDER — LACTATED RINGERS IV SOLN
INTRAVENOUS | Status: DC
  Administered 2019-10-13 (×2): via INTRAVENOUS

## 2019-10-13 MED ORDER — PHENYLEPHRINE HCL (PRESSORS) 10 MG/ML IV SOLN
INTRAVENOUS | Status: AC
  Filled 2019-10-13: qty 1

## 2019-10-13 MED ORDER — LIDOCAINE HCL (CARDIAC) PF 100 MG/5ML IV SOSY
PREFILLED_SYRINGE | INTRAVENOUS | Status: DC | PRN
  Administered 2019-10-13: 70 mg via INTRATRACHEAL

## 2019-10-13 MED ORDER — ONDANSETRON HCL 4 MG/2ML IJ SOLN: 4.0000 mg | Freq: Once | INTRAMUSCULAR | Status: DC | PRN

## 2019-10-13 MED ORDER — EPHEDRINE SULFATE 50 MG/ML IJ SOLN
INTRAMUSCULAR | Status: AC
  Filled 2019-10-13: qty 1

## 2019-10-13 MED ORDER — BELLADONNA ALKALOIDS-OPIUM 16.2-60 MG RE SUPP
RECTAL | Status: AC
  Filled 2019-10-13: qty 1

## 2019-10-13 MED ORDER — SUGAMMADEX SODIUM 200 MG/2ML IV SOLN
INTRAVENOUS | Status: AC
  Filled 2019-10-13: qty 2

## 2019-10-13 MED ORDER — CEFAZOLIN SODIUM-DEXTROSE 2-4 GM/100ML-% IV SOLN
2.0000 g | INTRAVENOUS | Status: AC
  Administered 2019-10-13: 08:00:00 2 g via INTRAVENOUS

## 2019-10-13 SURGICAL SUPPLY — 33 items
ADAPTER IRRIG TUBE 2 SPIKE SOL (ADAPTER) ×6 IMPLANT
BAG URO DRAIN 4000ML (MISCELLANEOUS) ×3 IMPLANT
CATH FOLEY 3WAY 30CC 24FR (CATHETERS) ×2
CATH URETL 5X70 OPEN END (CATHETERS) ×3 IMPLANT
CATH URTH STD 24FR FL 3W 2 (CATHETERS) ×1 IMPLANT
CONTAINER COLLECT MORCELLATR (MISCELLANEOUS) ×1 IMPLANT
DRAPE UTILITY 15X26 TOWEL STRL (DRAPES) ×3 IMPLANT
ELECT BIVAP BIPO 22/24 DONUT (ELECTROSURGICAL) ×3
ELECTRD BIVAP BIPO 22/24 DONUT (ELECTROSURGICAL) ×1 IMPLANT
FILTER OVERFLOW MORCELLATOR (FILTER) ×1 IMPLANT
GLOVE BIOGEL PI IND STRL 7.5 (GLOVE) ×1 IMPLANT
GLOVE BIOGEL PI INDICATOR 7.5 (GLOVE) ×2
GOWN STRL REUS W/ TWL LRG LVL3 (GOWN DISPOSABLE) ×1 IMPLANT
GOWN STRL REUS W/ TWL XL LVL3 (GOWN DISPOSABLE) ×1 IMPLANT
GOWN STRL REUS W/TWL LRG LVL3 (GOWN DISPOSABLE) ×2
GOWN STRL REUS W/TWL XL LVL3 (GOWN DISPOSABLE) ×2
GUIDEWIRE STR DUAL SENSOR (WIRE) IMPLANT
HOLDER FOLEY CATH W/STRAP (MISCELLANEOUS) ×3 IMPLANT
KIT TURNOVER CYSTO (KITS) ×3 IMPLANT
LASER FIBER 550M SMARTSCOPE (Laser) ×3 IMPLANT
MORCELLATOR COLLECT CONTAINER (MISCELLANEOUS) ×3
MORCELLATOR OVERFLOW FILTER (FILTER) ×3
MORCELLATOR ROTATION 4.75 335 (MISCELLANEOUS) ×6 IMPLANT
PACK CYSTO AR (MISCELLANEOUS) ×3 IMPLANT
SET CYSTO W/LG BORE CLAMP LF (SET/KITS/TRAYS/PACK) ×3 IMPLANT
SET IRRIG Y TYPE TUR BLADDER L (SET/KITS/TRAYS/PACK) ×3 IMPLANT
SLEEVE PROTECTION STRL DISP (MISCELLANEOUS) ×6 IMPLANT
SOL .9 NS 3000ML IRR  AL (IV SOLUTION) ×16
SOL .9 NS 3000ML IRR UROMATIC (IV SOLUTION) ×8 IMPLANT
SURGILUBE 2OZ TUBE FLIPTOP (MISCELLANEOUS) ×3 IMPLANT
SYRINGE IRR TOOMEY STRL 70CC (SYRINGE) ×3 IMPLANT
TUBE PUMP MORCELLATOR PIRANHA (TUBING) ×6 IMPLANT
WATER STERILE IRR 1000ML POUR (IV SOLUTION) ×3 IMPLANT

## 2019-10-13 NOTE — Anesthesia Postprocedure Evaluation (Signed)
Anesthesia Post Note  Patient: James Oconnor  Procedure(s) Performed: HOLEP-LASER ENUCLEATION OF THE PROSTATE WITH MORCELLATION (N/A Prostate)  Patient location during evaluation: PACU Anesthesia Type: General Level of consciousness: awake and alert Pain management: pain level controlled Vital Signs Assessment: post-procedure vital signs reviewed and stable Respiratory status: spontaneous breathing, nonlabored ventilation, respiratory function stable and patient connected to nasal cannula oxygen Cardiovascular status: blood pressure returned to baseline and stable Postop Assessment: no apparent nausea or vomiting Anesthetic complications: no     Last Vitals:  Vitals:   10/13/19 1216 10/13/19 1236  BP: (!) 148/95 (!) 145/90  Pulse: 73 73  Resp: 20 16  Temp: (!) 36.2 C 36.4 C  SpO2: 98% 98%    Last Pain:  Vitals:   10/13/19 1236  TempSrc: Temporal  PainSc: 0-No pain                 Martha Clan

## 2019-10-13 NOTE — Op Note (Signed)
Date of procedure: 10/13/19  Preoperative diagnosis:  1. BPH with Foley dependent urinary retention  Postoperative diagnosis:  1. Same  Procedure: 1. HoLEP  Surgeon: Nickolas Madrid, MD  Anesthesia: General  Complications: None  Intraoperative findings:  1.  Uncomplicated HOLEP 2.  Very large > 100 g prostate with lateral and median lobe enlargement 3.  Moderate to severe bladder trabeculations 4.  Verumontanum and ureteral orifices intact at conclusion of case  EBL: 25 mL  Specimens: Prostate tissue for permanent  Drains: 24 French three-way, 60 cc in balloon  Indication: James Oconnor is a 85 y.o. patient with long history of obstructive urinary symptoms, recurrent UTIs, and now Foley dependent urinary retention.  After reviewing the management options for treatment, they elected to proceed with the above surgical procedure(s). We have discussed the potential benefits and risks of the procedure, side effects of the proposed treatment, the likelihood of the patient achieving the goals of the procedure, and any potential problems that might occur during the procedure or recuperation.  We specifically discussed the risks of bleeding, infection, hematuria and clot retention, need for additional procedures, possible overnight hospital stay, temporary urgency and incontinence, rare long-term incontinence, and retrograde ejaculation.  Informed consent has been obtained.   Description of procedure:  The patient was taken to the operating room and general anesthesia was induced.  The patient was placed in the dorsal lithotomy position, prepped and draped in the usual sterile fashion, and preoperative antibiotics(cefazolin) were administered.  SCDs were placed for DVT prophylaxis.  A preoperative time-out was performed.   The 26 French continuous flow resectoscope was inserted into the urethra using the visual obturator  The prostate was very large with obstructing lateral lobes and a large  median lobe. The bladder was thoroughly inspected and notable for moderate to severe bladder trabeculations, but no mucosal lesions.  The ureteral orifices were located in orthotopic position.  The laser was set to 2 J and 50 Hz and was used to make an incision at the 5 and 7 o'clock position to the level of the capsule from the bladder neck to the verumontanum.  The median lobe was enucleated and pushed into the bladder.  The lateral lobes were then incised circumferentially until they were disconnected from the surrounding tissue.  The capsule was examined and laser was used for meticulous hemostasis.  There was a medium arterial bleeder at the 11 o'clock position that I could not control with the laser.  The resectoscope was advanced into the bladder and the bipolar button used to obtain hemostasis.  There was no active bleeding under low flow conditions.  The 31 French resectoscope was then switched out for the 19 French nephroscope and the lobes were morcellated and the tissue sent to pathology.  The morcellator was not functioning properly, and there was significant difficulty with the morcellator suction and morcellation took over an hour.  At the conclusion, all prostate tissue was removed.   A 24 French three-way catheter was inserted with the aid of a catheter guide, and CBI was initiated.  60 cc were placed in the balloon.  Urine was faint pink.  The patient tolerated the procedure well without any immediate complications and was extubated and transferred to the recovery room in stable condition.  Urine was clear on fast CBI.  Disposition: Stable to PACU  Plan: Wean CBI in PACU, anticipate discharge home today with void trial in clinic in 2-3 days  Nickolas Madrid, MD 10/13/2019

## 2019-10-13 NOTE — Anesthesia Preprocedure Evaluation (Signed)
Anesthesia Evaluation  Patient identified by MRN, date of birth, ID band Patient awake    Reviewed: Allergy & Precautions, H&P , NPO status , Patient's Chart, lab work & pertinent test results, reviewed documented beta blocker date and time   History of Anesthesia Complications Negative for: history of anesthetic complications  Airway Mallampati: I  TM Distance: >3 FB Neck ROM: full    Dental  (+) Dental Advidsory Given, Implants, Teeth Intact   Pulmonary neg pulmonary ROS, former smoker,    Pulmonary exam normal        Cardiovascular Exercise Tolerance: Good negative cardio ROS Normal cardiovascular exam     Neuro/Psych negative neurological ROS  negative psych ROS   GI/Hepatic negative GI ROS, Neg liver ROS,   Endo/Other  negative endocrine ROS  Renal/GU negative Renal ROS  negative genitourinary   Musculoskeletal   Abdominal   Peds  Hematology negative hematology ROS (+)   Anesthesia Other Findings Past Medical History: No date: Diabetes mellitus without complication (HCC) No date: Hypertension No date: Prostate enlargement No date: UTI (urinary tract infection)   Reproductive/Obstetrics negative OB ROS                             Anesthesia Physical Anesthesia Plan  ASA: II  Anesthesia Plan: General   Post-op Pain Management:    Induction: Intravenous  PONV Risk Score and Plan: 2 and Ondansetron, Dexamethasone and Treatment may vary due to age or medical condition  Airway Management Planned: LMA and Oral ETT  Additional Equipment:   Intra-op Plan:   Post-operative Plan: Extubation in OR  Informed Consent: I have reviewed the patients History and Physical, chart, labs and discussed the procedure including the risks, benefits and alternatives for the proposed anesthesia with the patient or authorized representative who has indicated his/her understanding and  acceptance.     Dental Advisory Given  Plan Discussed with: Anesthesiologist, CRNA and Surgeon  Anesthesia Plan Comments:         Anesthesia Quick Evaluation

## 2019-10-13 NOTE — Anesthesia Post-op Follow-up Note (Signed)
Anesthesia QCDR form completed.        

## 2019-10-13 NOTE — H&P (Signed)
UROLOGY H&P UPDATE  Agree with prior H&P dated 09/13/19.  85 year old male with BPH and Foley dependent urinary retention and baseline severe urinary symptoms of weak stream, straining, and incomplete emptying who has elected for HOLEP for an outlet procedure.  He also has a history of a elevated PSA of 7.2 and a prostate MRI showed a 150 g prostate with a small PI-RADS 4 lesion.  We previously discussed the risks and benefits of biopsying this prior to undergoing any outlet procedure, and the patient elected for an outlet procedure with the understanding that if he did have prostate cancer we could still do radiation in the future if needed.  Cardiac: RRR Lungs: CTA bilaterally  Laterality: n/a Procedure: HOLEP  Urine: 10/03/2019 culture with E. coli, has been on culture appropriate antibiotics  We discussed the risks and benefits of HOLEP at length.  The procedure requires general anesthesia and takes 2 to 3 hours, and a holmium laser is used to enucleate the prostate and push this tissue into the bladder.  A morcellator is then used to remove this tissue, which is sent for pathology.  Majority of patients are able to discharge the same day with a catheter in place for 2 to 3 days, and will follow-up in clinic for a voiding trial.  Approximately 10% of patients will be admitted overnight to monitor the urine, or if they have multiple comorbidities.  We specifically discussed the risks of bleeding, infection, retrograde ejaculation, temporary urgency and urge incontinence, very low risk of long-term incontinence, and possible need for additional procedures.   Billey Co, MD 10/13/2019

## 2019-10-13 NOTE — Progress Notes (Signed)
Per orders CBI weaned at 11:30am; clamped at 12:04pm. Urine pink and clear. VSS, pt has no complaints of pain. Will continue to assess.

## 2019-10-13 NOTE — Anesthesia Procedure Notes (Signed)
Procedure Name: Intubation Date/Time: 10/13/2019 7:40 AM Performed by: Eben Burow, CRNA Pre-anesthesia Checklist: Patient identified, Emergency Drugs available, Suction available and Patient being monitored Patient Re-evaluated:Patient Re-evaluated prior to induction Oxygen Delivery Method: Circle system utilized Preoxygenation: Pre-oxygenation with 100% oxygen Induction Type: IV induction Ventilation: Mask ventilation without difficulty Laryngoscope Size: McGraph and 4 Grade View: Grade I Tube type: Oral Tube size: 8.0 mm Number of attempts: 1 Airway Equipment and Method: Stylet,  Video-laryngoscopy and LTA kit utilized Placement Confirmation: positive ETCO2 and breath sounds checked- equal and bilateral Secured at: 23 cm Tube secured with: Tape Dental Injury: Teeth and Oropharynx as per pre-operative assessment

## 2019-10-13 NOTE — Transfer of Care (Signed)
Immediate Anesthesia Transfer of Care Note  Patient: James Oconnor  Procedure(s) Performed: HOLEP-LASER ENUCLEATION OF THE PROSTATE WITH MORCELLATION (N/A Prostate)  Patient Location: PACU  Anesthesia Type:General  Level of Consciousness: drowsy  Airway & Oxygen Therapy: Patient Spontanous Breathing and Patient connected to face mask oxygen  Post-op Assessment: Report given to RN and Post -op Vital signs reviewed and stable  Post vital signs: Reviewed and stable  Last Vitals:  Vitals Value Taken Time  BP 150/93 10/13/19 1111  Temp 36 C 10/13/19 1111  Pulse 77 10/13/19 1114  Resp 30 10/13/19 1114  SpO2 100 % 10/13/19 1114  Vitals shown include unvalidated device data.  Last Pain:  Vitals:   10/13/19 0631  TempSrc: Tympanic  PainSc: 0-No pain         Complications: No apparent anesthesia complications

## 2019-10-14 ENCOUNTER — Encounter: Payer: Self-pay | Admitting: Urology

## 2019-10-16 ENCOUNTER — Ambulatory Visit: Payer: Medicare HMO | Admitting: Physician Assistant

## 2019-10-16 ENCOUNTER — Ambulatory Visit (INDEPENDENT_AMBULATORY_CARE_PROVIDER_SITE_OTHER): Payer: Medicare HMO | Admitting: Physician Assistant

## 2019-10-16 ENCOUNTER — Other Ambulatory Visit: Payer: Self-pay

## 2019-10-16 ENCOUNTER — Encounter: Payer: Self-pay | Admitting: Physician Assistant

## 2019-10-16 VITALS — BP 121/76 | HR 100 | Ht 68.0 in | Wt 160.0 lb

## 2019-10-16 DIAGNOSIS — N401 Enlarged prostate with lower urinary tract symptoms: Secondary | ICD-10-CM

## 2019-10-16 DIAGNOSIS — N138 Other obstructive and reflux uropathy: Secondary | ICD-10-CM

## 2019-10-16 LAB — BLADDER SCAN AMB NON-IMAGING

## 2019-10-16 NOTE — Progress Notes (Signed)
Fill and Pull Catheter Removal  Patient is present today for a catheter removal.  Patient was cleaned and prepped in a sterile fashion 210ml of sterile saline was instilled into the bladder when the patient felt the urge to urinate. 60ml of water was then drained from the balloon.  A 24FR foley cath was removed from the bladder complications were noted as: bladder spasm .  Patient as then given some time to void on their own.  Patient can void  163ml on their own after some time; urine was noted clear, runny, and light pink.  Patient tolerated well; he was given absorbant underwear to protect against leakage.  Performed by: Debroah Loop, PA-C and Gaspar Cola, CMA  Follow up/ Additional notes: Return this afternoon for PVR.  Afternoon Follow-up  Patient returned to the office this afternoon for scheduled PVR.  Bladder scan with 258 mL.  He states he did urinate earlier today.  He reports constipation and has taken MiraLAX for this.  I advised him that I would like him to go purchase a Fleet enema and use it this afternoon.  He is to return tomorrow morning for repeat PVR.  I counseled the patient on signs and symptoms of urinary retention, including lower abdominal pain, lumbar pain, abdominal distention, and the inability to urinate.  I advised him to contact the office for assistance if he develops these symptoms during routine office hours, 8 AM to 5 PM Monday through Friday.  If outside those hours, I advised him to proceed to the emergency department. He expressed understanding.  Results for orders placed or performed in visit on 10/16/19  Bladder Scan (Post Void Residual) in office  Result Value Ref Range   Scan Result 258lb     Debroah Loop, PA-C 10/16/19 2:51 PM

## 2019-10-17 ENCOUNTER — Ambulatory Visit (INDEPENDENT_AMBULATORY_CARE_PROVIDER_SITE_OTHER): Payer: Medicare HMO | Admitting: Physician Assistant

## 2019-10-17 ENCOUNTER — Telehealth: Payer: Self-pay

## 2019-10-17 VITALS — BP 125/77 | HR 108 | Ht 68.0 in | Wt 160.0 lb

## 2019-10-17 DIAGNOSIS — R339 Retention of urine, unspecified: Secondary | ICD-10-CM | POA: Diagnosis not present

## 2019-10-17 LAB — SURGICAL PATHOLOGY

## 2019-10-17 LAB — BLADDER SCAN AMB NON-IMAGING: Scan Result: 225

## 2019-10-17 NOTE — Telephone Encounter (Signed)
-----   Message from Billey Co, MD sent at 10/17/2019  4:15 PM EST ----- Great news, no prostate cancer seen on HOLEP tissue. Keep follow up as scheduled  Nickolas Madrid, MD 10/17/2019

## 2019-10-17 NOTE — Telephone Encounter (Signed)
Called pt's wife (on [place]) informed her of the information below. Wife gave verbal understanding.

## 2019-10-17 NOTE — Progress Notes (Signed)
Patient returned to the office this morning for repeat PVR. He reports using a Fleet enema overnight and having a bowel movement. He reports urinating at home. PVR 245mL today. I counseled him to start a daily bowel regimen of 1 capful Miralax and return in one week for repeat PVR to confirm stability. He expressed understanding.  Results for orders placed or performed in visit on 10/17/19  Bladder Scan (Post Void Residual) in office  Result Value Ref Range   Scan Result 225     Return in about 1 week (around 10/24/2019) for PVR.  I spent 15 min with this patient, of which greater than 50% was spent in counseling and coordination of care with the patient.   Debroah Loop, PA-C 10/17/19 9:23 AM

## 2019-10-24 ENCOUNTER — Other Ambulatory Visit: Payer: Self-pay

## 2019-10-24 ENCOUNTER — Ambulatory Visit (INDEPENDENT_AMBULATORY_CARE_PROVIDER_SITE_OTHER): Payer: Medicare HMO | Admitting: Physician Assistant

## 2019-10-24 VITALS — BP 138/77 | HR 74 | Ht 68.0 in | Wt 160.0 lb

## 2019-10-24 DIAGNOSIS — R339 Retention of urine, unspecified: Secondary | ICD-10-CM

## 2019-10-24 DIAGNOSIS — N400 Enlarged prostate without lower urinary tract symptoms: Secondary | ICD-10-CM

## 2019-10-24 LAB — BLADDER SCAN AMB NON-IMAGING: Scan Result: 134

## 2019-10-24 NOTE — Progress Notes (Signed)
10/24/2019 10:17 AM   James Oconnor November 19, 1930 [id number]  CC: Follow-up PVR  HPI: James Oconnor is a 85 y.o. male who presents today for follow-up PVR.  I saw him last week for fill and pull voiding trial following HOLEP.  He had elevated PVRs of 225 to 275 mL but reported significant constipation at the time.  He returns today for reevaluation.  PVR 134 mL today.    He reports voiding well.  He states he has had an approximate 3 to 32-month history of chest congestion.  He took Mucinex DM for treatment of this earlier this week and noted difficulty urinating and "thick" urine associated with this med.  He states his urine was clear and yellow at the time and denies gross hematuria.  He stopped this medication approximately 3 days ago and states his urinary symptoms have returned to baseline.  PMH: Past Medical History:  Diagnosis Date  . Diabetes mellitus without complication ([place])   . Hypertension   . Prostate enlargement   . UTI (urinary tract infection)     Surgical History: Past Surgical History:  Procedure Laterality Date  . HOLEP-LASER ENUCLEATION OF THE PROSTATE WITH MORCELLATION N/A 10/13/2019   Procedure: HOLEP-LASER ENUCLEATION OF THE PROSTATE WITH MORCELLATION;  Surgeon: Billey Co, MD;  Location: [facility] ORS;  Service: Urology;  Laterality: N/A;  . NO PAST SURGERIES    . none      Home Medications:  Allergies as of 10/24/2019   No Known Allergies     Medication List       Accurate as of October 24, 2019 10:17 AM. If you have any questions, ask your nurse or doctor.        STOP taking these medications   amoxicillin-clavulanate 875-125 MG tablet Commonly known as: AUGMENTIN Stopped by: James Loop, PA-C     TAKE these medications   lisinopril 10 MG tablet Commonly known as: ZESTRIL Take 10 mg by mouth daily.   tadalafil 5 MG tablet Commonly known as: CIALIS Take 5 mg by mouth daily.       Allergies:  No Known Allergies   Family History: Family History  Problem Relation Age of Onset  . Lung cancer Father   . Heart disease Sister   . Lung cancer Brother   . Throat cancer Brother     Social History:   reports that he quit smoking about 63 years ago. His smoking use included cigarettes. He quit after 60.00 years of use. He has never used smokeless tobacco. He reports previous alcohol use. He reports previous drug use.  ROS: UROLOGY Frequent Urination?: No Hard to postpone urination?: No Burning/pain with urination?: No Get up at night to urinate?: No Leakage of urine?: No Urine stream starts and stops?: No Trouble starting stream?: No Do you have to strain to urinate?: No Blood in urine?: No Urinary tract infection?: No Sexually transmitted disease?: No Injury to kidneys or bladder?: No Painful intercourse?: No Weak stream?: No Erection problems?: No Penile pain?: No  Gastrointestinal Nausea?: No Vomiting?: No Indigestion/heartburn?: No Diarrhea?: No Constipation?: No  Constitutional Fever: No Night sweats?: No Weight loss?: Yes Fatigue?: Yes  Skin Skin rash/lesions?: No Itching?: No  Eyes Blurred vision?: No Double vision?: No  Ears/Nose/Throat Sore throat?: No Sinus problems?: No  Hematologic/Lymphatic Swollen glands?: No Easy bruising?: No  Cardiovascular Leg swelling?: No Chest pain?: No  Respiratory Cough?: Yes Shortness of breath?: Yes  Endocrine Excessive thirst?: No  Musculoskeletal Back pain?: No Joint  pain?: No  Neurological Headaches?: No Dizziness?: No  Psychologic Depression?: No Anxiety?: No  Physical Exam: BP 138/77   Pulse 74   Ht 5\' 8"  (1.727 m)   Wt 160 lb (72.6 kg)   BMI 24.33 kg/m   Constitutional:  Alert and oriented, no acute distress, nontoxic appearing HEENT: [place], AT Cardiovascular: No clubbing, cyanosis, or edema Respiratory: Normal respiratory effort, no increased work of breathing GI: Abdomen is soft, nontender,  nondistended, no abdominal masses Neurologic: Grossly intact, no focal deficits, moving all 4 extremities Psychiatric: Normal mood and affect  Laboratory Data: Results for orders placed or performed in visit on 10/24/19  Bladder Scan (Post Void Residual) in office  Result Value Ref Range   Scan Result 134    Assessment & Plan:   1. Benign prostatic hyperplasia, unspecified whether lower urinary tract symptoms present 85 year old male returns to clinic for follow-up PVR.  He is POD 11 from HOLEP.  Fill and pull voiding trial last week complicated by elevated PVRs in the 200 mL range associated with constipation, now resolved.  PVR today WNL.  No further intervention required.  Advised patient to increase p.o. fluid intake while taking Mucinex DM and see if this resolves his urinary thickening.  If his symptoms persist, advised him to stop this medication.  He expressed understanding and states he will return to his primary care to discuss further.  I am in agreement with this plan.  Advised him to keep his already scheduled follow-up appointment with Dr. Diamantina Providence next month. - Bladder Scan (Post Void Residual) in office   Return if symptoms worsen or fail to improve.  James Loop, PA-C  [facility] [address] [place], [place] [address] [phone]

## 2019-11-13 ENCOUNTER — Telehealth: Payer: Self-pay | Admitting: Urology

## 2019-11-27 ENCOUNTER — Other Ambulatory Visit: Payer: Self-pay

## 2019-11-27 ENCOUNTER — Other Ambulatory Visit
Admission: RE | Admit: 2019-11-27 | Discharge: 2019-11-27 | Disposition: A | Discharge status: Home / Self Care | Payer: Medicare HMO | Source: Ambulatory Visit | Attending: Internal Medicine | Admitting: Internal Medicine

## 2019-11-27 DIAGNOSIS — Z01812 Encounter for preprocedural laboratory examination: Secondary | ICD-10-CM | POA: Diagnosis present

## 2019-11-27 DIAGNOSIS — Z20828 Contact with and (suspected) exposure to other viral communicable diseases: Secondary | ICD-10-CM | POA: Diagnosis not present

## 2019-11-28 LAB — SARS CORONAVIRUS 2 (TAT 6-24 HRS): SARS Coronavirus 2: NEGATIVE

## 2019-11-29 ENCOUNTER — Encounter: Payer: Self-pay | Admitting: Internal Medicine

## 2019-11-30 ENCOUNTER — Ambulatory Visit: Payer: Medicare HMO | Admitting: Urology

## 2019-11-30 ENCOUNTER — Ambulatory Visit
Admission: RE | Admit: 2019-11-30 | Discharge: 2019-11-30 | Disposition: A | Discharge status: Home / Self Care | Payer: Medicare HMO | Attending: Internal Medicine | Admitting: Internal Medicine

## 2019-11-30 ENCOUNTER — Ambulatory Visit: Payer: Medicare HMO | Admitting: Anesthesiology

## 2019-11-30 ENCOUNTER — Encounter: Payer: Self-pay | Admitting: Internal Medicine

## 2019-11-30 ENCOUNTER — Encounter
Admission: RE | Disposition: A | Discharge status: Home / Self Care | Payer: Self-pay | Source: Home / Self Care | Attending: Internal Medicine

## 2019-11-30 DIAGNOSIS — R634 Abnormal weight loss: Secondary | ICD-10-CM | POA: Diagnosis not present

## 2019-11-30 DIAGNOSIS — N400 Enlarged prostate without lower urinary tract symptoms: Secondary | ICD-10-CM | POA: Insufficient documentation

## 2019-11-30 DIAGNOSIS — R1314 Dysphagia, pharyngoesophageal phase: Secondary | ICD-10-CM | POA: Diagnosis not present

## 2019-11-30 DIAGNOSIS — K642 Third degree hemorrhoids: Secondary | ICD-10-CM | POA: Diagnosis not present

## 2019-11-30 DIAGNOSIS — Z87891 Personal history of nicotine dependence: Secondary | ICD-10-CM | POA: Diagnosis not present

## 2019-11-30 DIAGNOSIS — E119 Type 2 diabetes mellitus without complications: Secondary | ICD-10-CM | POA: Insufficient documentation

## 2019-11-30 DIAGNOSIS — K5730 Diverticulosis of large intestine without perforation or abscess without bleeding: Secondary | ICD-10-CM | POA: Insufficient documentation

## 2019-11-30 DIAGNOSIS — K635 Polyp of colon: Secondary | ICD-10-CM | POA: Diagnosis not present

## 2019-11-30 DIAGNOSIS — Z6825 Body mass index (BMI) 25.0-25.9, adult: Secondary | ICD-10-CM | POA: Diagnosis not present

## 2019-11-30 DIAGNOSIS — I10 Essential (primary) hypertension: Secondary | ICD-10-CM | POA: Insufficient documentation

## 2019-11-30 DIAGNOSIS — K228 Other specified diseases of esophagus: Secondary | ICD-10-CM | POA: Diagnosis not present

## 2019-11-30 DIAGNOSIS — Z79899 Other long term (current) drug therapy: Secondary | ICD-10-CM | POA: Diagnosis not present

## 2019-11-30 DIAGNOSIS — K449 Diaphragmatic hernia without obstruction or gangrene: Secondary | ICD-10-CM | POA: Diagnosis not present

## 2019-11-30 DIAGNOSIS — R194 Change in bowel habit: Secondary | ICD-10-CM | POA: Diagnosis present

## 2019-11-30 HISTORY — PX: COLONOSCOPY WITH PROPOFOL: SHX5780

## 2019-11-30 HISTORY — DX: Overactive bladder: N32.81

## 2019-11-30 HISTORY — PX: ESOPHAGOGASTRODUODENOSCOPY (EGD) WITH PROPOFOL: SHX5813

## 2019-11-30 HISTORY — DX: Benign prostatic hyperplasia without lower urinary tract symptoms: N40.0

## 2019-11-30 SURGERY — COLONOSCOPY WITH PROPOFOL
Anesthesia: General

## 2019-11-30 MED ORDER — PROPOFOL 500 MG/50ML IV EMUL
INTRAVENOUS | Status: DC | PRN
  Administered 2019-11-30: 150 ug/kg/min via INTRAVENOUS

## 2019-11-30 MED ORDER — SODIUM CHLORIDE 0.9 % IV SOLN: INTRAVENOUS | Status: DC | PRN

## 2019-11-30 MED ORDER — LIDOCAINE HCL (PF) 2 % IJ SOLN
INTRAMUSCULAR | Status: AC
  Filled 2019-11-30: qty 10

## 2019-11-30 MED ORDER — PROPOFOL 10 MG/ML IV BOLUS
INTRAVENOUS | Status: DC | PRN
  Administered 2019-11-30 (×2): 20 mg via INTRAVENOUS
  Administered 2019-11-30: 10 mg via INTRAVENOUS

## 2019-11-30 MED ORDER — LIDOCAINE HCL (CARDIAC) PF 100 MG/5ML IV SOSY
PREFILLED_SYRINGE | INTRAVENOUS | Status: DC | PRN
  Administered 2019-11-30: 40 mg via INTRAVENOUS

## 2019-11-30 MED ORDER — SODIUM CHLORIDE 0.9 % IV SOLN
INTRAVENOUS | Status: DC | PRN
  Administered 2019-11-30 (×3): 200 ug via INTRAVENOUS
  Administered 2019-11-30: 13:00:00 100 ug via INTRAVENOUS

## 2019-11-30 MED ORDER — PHENYLEPHRINE HCL (PRESSORS) 10 MG/ML IV SOLN
INTRAVENOUS | Status: AC
  Filled 2019-11-30: qty 1

## 2019-11-30 MED ORDER — SODIUM CHLORIDE 0.9 % IV SOLN: INTRAVENOUS | Status: DC

## 2019-11-30 NOTE — Interval H&P Note (Signed)
History and Physical Interval Note:  11/30/2019 12:51 PM  James Oconnor  has presented today for surgery, with the diagnosis of [place].  The various methods of treatment have been discussed with the patient and family. After consideration of risks, benefits and other options for treatment, the patient has consented to  Procedure(s): COLONOSCOPY WITH PROPOFOL (N/A) ESOPHAGOGASTRODUODENOSCOPY (EGD) WITH PROPOFOL (N/A) as a surgical intervention.  The patient's history has been reviewed, patient examined, no change in status, stable for surgery.  I have reviewed the patient's chart and labs.  Questions were answered to the patient's satisfaction.     [place], [place]

## 2019-11-30 NOTE — Op Note (Signed)
[facility] Gastroenterology Patient Name: James Oconnor Procedure Date: 11/30/2019 12:24 PM MRN: [id number] Account #: [id number] Date of Birth: Jan 13, 1930 Admit Type: Outpatient Age: 85 Room: [facility] ENDO ROOM 1 Gender: Male Note Status: Finalized Procedure:             Colonoscopy Indications:           Change in bowel habits, Weight loss Providers:             Benay Pike. Alice Reichert MD, MD Referring MD:          Dion Body (Referring MD), Minna Merritts,                         MD (Referring MD) Medicines:             Propofol per Anesthesia Complications:         No immediate complications. Procedure:             Pre-Anesthesia Assessment:                        - The risks and benefits of the procedure and the                         sedation options and risks were discussed with the                         patient. All questions were answered and informed                         consent was obtained.                        - Patient identification and proposed procedure were                         verified prior to the procedure by the nurse. The                         procedure was verified in the procedure room.                        - ASA Grade Assessment: III - A patient with severe                         systemic disease.                        - After reviewing the risks and benefits, the patient                         was deemed in satisfactory condition to undergo the                         procedure.                        After obtaining informed consent, the colonoscope was                         passed under direct vision. Throughout the  procedure,                         the patient's blood pressure, pulse, and oxygen                         saturations were monitored continuously. The                         Colonoscope was introduced through the anus and                         advanced to the the cecum, identified by appendiceal                        orifice and ileocecal valve. The colonoscopy was                         performed without difficulty. The patient tolerated                         the procedure well. The quality of the bowel                         preparation was good. The ileocecal valve, appendiceal                         orifice, and rectum were photographed. Findings:      The perianal and digital rectal examinations were normal. Pertinent       negatives include normal sphincter tone and no palpable rectal lesions.      Non-bleeding internal hemorrhoids were found during retroflexion. The       hemorrhoids were Grade III (internal hemorrhoids that prolapse but       require manual reduction).      Many small and large-mouthed diverticula were found in the sigmoid colon.      A 3 mm polyp was found in the sigmoid colon. The polyp was sessile. The       polyp was removed with a jumbo cold forceps. Resection and retrieval       were complete.      The exam was otherwise without abnormality. Impression:            - Non-bleeding internal hemorrhoids.                        - Diverticulosis in the sigmoid colon.                        - One 3 mm polyp in the sigmoid colon, removed with a                         jumbo cold forceps. Resected and retrieved.                        - The examination was otherwise normal. Recommendation:        - Monitor results to esophageal dilation                        - Patient has a contact number available for  emergencies. The signs and symptoms of potential                         delayed complications were discussed with the patient.                         Return to normal activities tomorrow. Written                         discharge instructions were provided to the patient.                        - Resume previous diet.                        - Continue present medications.                        - Await pathology results.                         - No repeat colonoscopy due to current age (23 years                         or older).                        - Return to physician assistant in 3 months.                        - The findings and recommendations were discussed with                         the patient. Procedure Code(s):     --- Professional ---                        [phone], Colonoscopy, flexible; with biopsy, single or                         multiple Diagnosis Code(s):     --- Professional ---                        K57.30, Diverticulosis of large intestine without                         perforation or abscess without bleeding                        R63.4, Abnormal weight loss                        R19.4, Change in bowel habit                        K63.5, Polyp of colon                        K64.2, Third degree hemorrhoids CPT copyright 2019 American Medical Association. All rights reserved. The codes documented in this report are preliminary and upon coder review may  be revised to meet current compliance requirements. Benay Pike  Colen Eltzroth MD, MD 11/30/2019 1:21:19 PM This report has been signed electronically. Number of Addenda: 0 Note Initiated On: 11/30/2019 12:24 PM Scope Withdrawal Time: 0 hours 5 minutes 53 seconds  Total Procedure Duration: 0 hours 8 minutes 30 seconds  Estimated Blood Loss:  Estimated blood loss: none.      [facility]

## 2019-11-30 NOTE — Anesthesia Preprocedure Evaluation (Signed)
Anesthesia Evaluation  Patient identified by MRN, date of birth, ID band Patient awake    Reviewed: Allergy & Precautions, H&P , NPO status , reviewed documented beta blocker date and time   Airway Mallampati: II  TM Distance: >3 FB Neck ROM: full    Dental  (+) Implants   Pulmonary former smoker,    Pulmonary exam normal        Cardiovascular hypertension, Normal cardiovascular exam  08/2019 ECHO IMPRESSIONS    1. Left ventricular ejection fraction, by visual estimation, is 60 to 65%. The left ventricle has normal function. Normal left ventricular size. There is mildly increased left ventricular hypertrophy.  2. Left ventricular diastolic Doppler parameters are consistent with impaired relaxation pattern of LV diastolic filling.  3. Global right ventricle has normal systolic function.The right ventricular size is normal. No increase in right ventricular wall thickness.  4. Left atrial size was normal.  5. TR signal is inadequate for assessing pulmonary artery systolic pressure.  6. The inferior vena cava is normal in size with greater than 50% respiratory variability, suggesting right atrial pressure of 3 mmHg.    Neuro/Psych    GI/Hepatic   Endo/Other  diabetes  Renal/GU      Musculoskeletal   Abdominal   Peds  Hematology   Anesthesia Other Findings Past Medical History: No date: BPH (benign prostatic hyperplasia) No date: Diabetes mellitus without complication (HCC) No date: Hypertension No date: Overactive bladder No date: Prostate enlargement No date: UTI (urinary tract infection)  Past Surgical History: 10/13/2019: HOLEP-LASER ENUCLEATION OF THE PROSTATE WITH  MORCELLATION; N/A     Comment:  Procedure: HOLEP-LASER ENUCLEATION OF THE PROSTATE WITH               MORCELLATION;  Surgeon: Billey Co, MD;  Location:               [facility] ORS;  Service: Urology;  Laterality: N/A; No date: NO PAST  SURGERIES No date: none     Reproductive/Obstetrics                             Anesthesia Physical Anesthesia Plan  ASA: II  Anesthesia Plan: General   Post-op Pain Management:    Induction: Intravenous  PONV Risk Score and Plan: Treatment may vary due to age or medical condition and TIVA  Airway Management Planned: Nasal Cannula and Natural Airway  Additional Equipment:   Intra-op Plan:   Post-operative Plan:   Informed Consent: I have reviewed the patients History and Physical, chart, labs and discussed the procedure including the risks, benefits and alternatives for the proposed anesthesia with the patient or authorized representative who has indicated his/her understanding and acceptance.     Dental Advisory Given  Plan Discussed with: CRNA  Anesthesia Plan Comments:         Anesthesia Quick Evaluation

## 2019-11-30 NOTE — Anesthesia Post-op Follow-up Note (Signed)
Anesthesia QCDR form completed.        

## 2019-11-30 NOTE — Transfer of Care (Signed)
Immediate Anesthesia Transfer of Care Note  Patient: James Oconnor  Procedure(s) Performed: COLONOSCOPY WITH PROPOFOL (N/A ) ESOPHAGOGASTRODUODENOSCOPY (EGD) WITH PROPOFOL (N/A )  Patient Location: PACU and Endoscopy Unit  Anesthesia Type:General  Level of Consciousness: sedated  Airway & Oxygen Therapy: Patient Spontanous Breathing  Post-op Assessment: Report given to RN and Post -op Vital signs reviewed and stable  Post vital signs: Reviewed and stable  Last Vitals:  Vitals Value Taken Time  BP    Temp    Pulse    Resp    SpO2      Last Pain:  Vitals:   11/30/19 1200  TempSrc: Temporal  PainSc: 0-No pain         Complications: No apparent anesthesia complications

## 2019-11-30 NOTE — H&P (Signed)
Outpatient short stay form Pre-procedure 11/30/2019 11:35 AM James Oconnor K. Alice Reichert, M.D.  Primary Physician: Dion Body, M.D.  Reason for visit:  Dysphagia, weight loss, abnormal xray of the UGI tract.  History of present illness:  85 y/o male, in otherwise good health, presents for dysphagia to solids, weight loss of over 22 lbs and abnormal GI xray results. He is undergoing evaluation for possible renal cell carcinoma. Had normal modified barium swallow with no oropharyngeal component. Had Barium swallow in 08/2019 showing mild esophageal motility and "mass effect" of the middle esophagus with narrowing noted from external compression from the aortic arch. No other abnormalities were noted.    No current facility-administered medications for this encounter.  Current Outpatient Medications:  .  finasteride (PROSCAR) 5 MG tablet, Take 5 mg by mouth daily., Disp: , Rfl:  .  lisinopril (ZESTRIL) 10 MG tablet, Take 10 mg by mouth daily., Disp: , Rfl:  .  tadalafil (CIALIS) 5 MG tablet, Take 5 mg by mouth daily., Disp: , Rfl:   No medications prior to admission.     Allergies  Allergen Reactions  . Oxybutynin      Past Medical History:  Diagnosis Date  . BPH (benign prostatic hyperplasia)   . Diabetes mellitus without complication ([place])   . Hypertension   . Overactive bladder   . Prostate enlargement   . UTI (urinary tract infection)     Review of systems:  Otherwise negative.    Physical Exam  Gen: Alert, oriented. Appears stated age.  HEENT: [place]/AT. PERRLA. Lungs: CTA, no wheezes. CV: RR nl S1, S2. Abd: soft, benign, no masses. BS+ Ext: No edema. Pulses 2+    Planned procedures: Proceed with EGD. The patient understands the nature of the planned procedure, indications, risks, alternatives and potential complications including but not limited to bleeding, infection, perforation, damage to internal organs and possible oversedation/side effects from anesthesia. The  patient agrees and gives consent to proceed.  Please refer to procedure notes for findings, recommendations and patient disposition/instructions.     James Oconnor K. Alice Reichert, M.D. Gastroenterology 11/30/2019  11:35 AM

## 2019-11-30 NOTE — Op Note (Signed)
[facility] Gastroenterology Patient Name: James Oconnor Procedure Date: 11/30/2019 12:25 PM MRN: [id number] Account #: [id number] Date of Birth: October 16, 1930 Admit Type: Outpatient Age: 85 Room: [facility] ENDO ROOM 1 Gender: Male Note Status: Finalized Procedure:             Upper GI endoscopy Indications:           Esophageal dysphagia, Abnormal UGI series, Weight loss Providers:             Benay Pike. Alice Reichert MD, MD Referring MD:          Dion Body (Referring MD), Minna Merritts,                         MD (Referring MD) Medicines:             Propofol per Anesthesia Complications:         No immediate complications. Procedure:             Pre-Anesthesia Assessment:                        - The risks and benefits of the procedure and the                         sedation options and risks were discussed with the                         patient. All questions were answered and informed                         consent was obtained.                        - Patient identification and proposed procedure were                         verified prior to the procedure by the nurse. The                         procedure was verified in the procedure room.                        - ASA Grade Assessment: III - A patient with severe                         systemic disease.                        - After reviewing the risks and benefits, the patient                         was deemed in satisfactory condition to undergo the                         procedure.                        After obtaining informed consent, the endoscope was                         passed under direct  vision. Throughout the procedure,                         the patient's blood pressure, pulse, and oxygen                         saturations were monitored continuously. The Endoscope                         was introduced through the mouth, and advanced to the                         third part of  duodenum. The upper GI endoscopy was                         accomplished without difficulty. The patient tolerated                         the procedure well. Findings:      The mid esophagus was moderately tortuous.      Non-occlusive esophageal "B" ring, The scope was withdrawn. Dilation was       performed with a Maloney dilator with mild resistance at 5 Fr.      A 1 cm hiatal hernia was present.      The examined duodenum was normal.      The exam was otherwise without abnormality. Impression:            - Tortuous esophagus.                        - 1 cm hiatal hernia.                        - Normal examined duodenum.                        - The examination was otherwise normal.                        - No specimens collected. Recommendation:        - Monitor results to esophageal dilation                        - Proceed with colonoscopy Procedure Code(s):     --- Professional ---                        [phone], Esophagogastroduodenoscopy, flexible,                         transoral; diagnostic, including collection of                         specimen(s) by brushing or washing, when performed                         (separate procedure)                        43450, Dilation of esophagus, by unguided sound or  bougie, single or multiple passes Diagnosis Code(s):     --- Professional ---                        R93.3, Abnormal findings on diagnostic imaging of                         other parts of digestive tract                        R63.4, Abnormal weight loss                        R13.14, Dysphagia, pharyngoesophageal phase                        K44.9, Diaphragmatic hernia without obstruction or                         gangrene                        Q39.9, Congenital malformation of esophagus,                         unspecified CPT copyright 2019 American Medical Association. All rights reserved. The codes documented in this report are  preliminary and upon coder review may  be revised to meet current compliance requirements. Efrain Sella MD, MD 11/30/2019 1:08:15 PM This report has been signed electronically. Number of Addenda: 0 Note Initiated On: 11/30/2019 12:25 PM Estimated Blood Loss:  Estimated blood loss: none.      [facility]

## 2019-12-01 ENCOUNTER — Encounter: Payer: Self-pay | Admitting: *Deleted

## 2019-12-04 LAB — SURGICAL PATHOLOGY

## 2019-12-06 ENCOUNTER — Encounter: Payer: Self-pay | Admitting: Urology

## 2019-12-06 ENCOUNTER — Ambulatory Visit (INDEPENDENT_AMBULATORY_CARE_PROVIDER_SITE_OTHER): Payer: Medicare Other | Admitting: Urology

## 2019-12-06 ENCOUNTER — Other Ambulatory Visit: Payer: Self-pay

## 2019-12-06 VITALS — BP 120/70 | HR 74 | Ht 61.0 in | Wt 160.0 lb

## 2019-12-06 DIAGNOSIS — R339 Retention of urine, unspecified: Secondary | ICD-10-CM | POA: Diagnosis not present

## 2019-12-06 LAB — BLADDER SCAN AMB NON-IMAGING: Scan Result: 67

## 2019-12-06 NOTE — Progress Notes (Signed)
   12/06/2019 1:54 PM   James Oconnor [birth year] [id number]  Reason for visit: Follow up HoLEP  HPI: I saw [name] back in urology clinic for follow-up after HoLEP.  He is an 86 year old male who had BPH symptoms of weak stream, incomplete emptying, recurrent UTIs, and ultimately Foley dependent urinary retention who underwent an uncomplicated HoLEP with removal of 100 g tissue on 10/13/2019.  Since surgery has been doing well and passed a voiding trial postoperatively.  He is voiding well with a strong stream and denies any incontinence.  He has nocturia 2-3 times per night that is minimally bothersome.  His PVR in clinic today is 62 mL.  He has not had any UTI since surgery.  We discussed behavioral strategies regarding his nocturia including minimizing fluid in the evening, double voiding prior to bed.  Follow-up in 9 months with PVR, if doing well at that visit can follow-up as needed  A total of 20 minutes were spent face-to-face with the patient, greater than 50% was spent in patient education, counseling, and coordination of care regarding postop expectations after HOLEP.  Billey Co, [facility] [address] [place], [place] [address] [phone]

## 2019-12-07 ENCOUNTER — Ambulatory Visit: Payer: Medicare HMO | Admitting: Urology

## 2019-12-07 NOTE — Anesthesia Postprocedure Evaluation (Signed)
Anesthesia Post Note  Patient: James Oconnor  Procedure(s) Performed: COLONOSCOPY WITH PROPOFOL (N/A ) ESOPHAGOGASTRODUODENOSCOPY (EGD) WITH PROPOFOL (N/A )  Patient location during evaluation: Endoscopy Anesthesia Type: General Level of consciousness: awake and alert Pain management: pain level controlled Vital Signs Assessment: post-procedure vital signs reviewed and stable Respiratory status: spontaneous breathing, nonlabored ventilation and respiratory function stable Cardiovascular status: blood pressure returned to baseline and stable Postop Assessment: no apparent nausea or vomiting Anesthetic complications: no     Last Vitals:  Vitals:   11/30/19 1342 11/30/19 1352  BP: 114/85 (!) 142/91  Pulse:    Resp:    Temp:    SpO2:      Last Pain:  Vitals:   12/01/19 1356  TempSrc:   PainSc: 0-No pain                 Alphonsus Sias

## 2019-12-29 NOTE — Progress Notes (Deleted)
[place]  [phone]) [phone] [phone]) [phone]  ID: James Oconnor OB: [birth year]  MR#: [id number]  [id number]  Patient Care Team: Dion Body, MD as PCP - General (Family Medicine) Minna Merritts, MD as PCP - Cardiology (Cardiology)  CHIEF COMPLAINT: Left renal mass, weight loss.  INTERVAL HISTORY: Patient is an 86 year old male who was recently noted to have a left renal mass as well as unintentional weight loss.  He also has an elevated PSA and problems with urination which are being followed closely by urology.  He also complains of intermittent constipation.  He has no neurologic complaints.  He denies any recent fevers or illnesses.  He has a poor appetite.  He has no chest pain, shortness of breath, cough, or hemoptysis.  He denies any nausea, vomiting, or diarrhea.  Patient offers no further specific complaints today.    REVIEW OF SYSTEMS:   Review of Systems  Constitutional: Positive for weight loss. Negative for fever and malaise/fatigue.  Respiratory: Negative for cough and shortness of breath.   Cardiovascular: Negative.  Negative for chest pain and leg swelling.  Gastrointestinal: Positive for constipation. Negative for abdominal pain.  Genitourinary: Positive for frequency and urgency.  Musculoskeletal: Negative.  Negative for back pain.  Skin: Negative.  Negative for rash.  Neurological: Negative.  Negative for dizziness, focal weakness, weakness and headaches.  Psychiatric/Behavioral: Negative.  The patient is not nervous/anxious.     As per HPI. Otherwise, a complete review of systems is negative.  PAST MEDICAL HISTORY: Past Medical History:  Diagnosis Date  . BPH (benign prostatic hyperplasia)   . Hypertension   . Overactive bladder   . Prostate enlargement   . UTI (urinary tract infection)     PAST SURGICAL HISTORY: Past Surgical History:  Procedure Laterality Date  . COLONOSCOPY WITH PROPOFOL N/A 11/30/2019   Procedure: COLONOSCOPY WITH PROPOFOL;  Surgeon: Toledo, Benay Pike, MD;  Location: [facility] ENDOSCOPY;  Service: Gastroenterology;  Laterality: N/A;  . ESOPHAGOGASTRODUODENOSCOPY (EGD) WITH PROPOFOL N/A 11/30/2019   Procedure: ESOPHAGOGASTRODUODENOSCOPY (EGD) WITH PROPOFOL;  Surgeon: Toledo, Benay Pike, MD;  Location: [facility] ENDOSCOPY;  Service: Gastroenterology;  Laterality: N/A;  . HOLEP-LASER ENUCLEATION OF THE PROSTATE WITH MORCELLATION N/A 10/13/2019   Procedure: HOLEP-LASER ENUCLEATION OF THE PROSTATE WITH MORCELLATION;  Surgeon: Billey Co, MD;  Location: [facility] ORS;  Service: Urology;  Laterality: N/A;  . NO PAST SURGERIES    . none      FAMILY HISTORY: Family History  Problem Relation Age of Onset  . Lung cancer Father   . Heart disease Sister   . Lung cancer Brother   . Throat cancer Brother     ADVANCED DIRECTIVES (Y/N):  N  HEALTH MAINTENANCE: Social History   Tobacco Use  . Smoking status: Former Smoker    Years: 60.00    Types: Cigarettes    Quit date: 1957    Years since quitting: 64.1  . Smokeless tobacco: Never Used  . Tobacco comment: quit smoking 63yrs ago  Substance Use Topics  . Alcohol use: Not Currently  . Drug use: Not Currently     Colonoscopy:  PAP:  Bone density:  Lipid panel:  Allergies  Allergen Reactions  . Oxybutynin     Current Outpatient Medications  Medication Sig Dispense Refill  . lisinopril (ZESTRIL) 10 MG tablet Take 10 mg by mouth daily.    . mirtazapine (REMERON) 7.5 MG tablet Take 7.5 mg by mouth at bedtime.    . tadalafil (CIALIS) 5 MG  tablet Take 5 mg by mouth daily.     No current facility-administered medications for this visit.    OBJECTIVE: There were no vitals filed for this visit.   There is no height or weight on file to calculate BMI.    ECOG FS:1 - Symptomatic but completely ambulatory  General: Thin, no acute distress. Eyes: Pink conjunctiva, anicteric sclera. HEENT: Normocephalic, moist mucous membranes,  clear oropharnyx. Lungs: Clear to auscultation bilaterally. Heart: Regular rate and rhythm. No rubs, murmurs, or gallops. Abdomen: Soft, nontender, nondistended. No organomegaly noted, normoactive bowel sounds. Musculoskeletal: No edema, cyanosis, or clubbing. Neuro: Alert, answering all questions appropriately. Cranial nerves grossly intact. Skin: No rashes or petechiae noted. Psych: Normal affect. Lymphatics: No cervical, calvicular, axillary or inguinal LAD.   LAB RESULTS:  Lab Results  Component Value Date   NA 136 10/02/2019   K 4.5 10/02/2019   CL 102 10/02/2019   CO2 22 10/02/2019   GLUCOSE 98 10/02/2019   BUN 19 10/02/2019   CREATININE 1.21 10/02/2019   CALCIUM 8.4 (L) 10/02/2019   PROT 7.6 10/02/2019   ALBUMIN 3.0 (L) 10/02/2019   AST 39 10/02/2019   ALT 33 10/02/2019   ALKPHOS 52 10/02/2019   BILITOT 0.4 10/02/2019   GFRNONAA 54 (L) 10/02/2019   GFRAA >60 10/02/2019    Lab Results  Component Value Date   WBC 9.3 10/02/2019   NEUTROABS 5.3 10/02/2019   HGB 11.6 (L) 10/02/2019   HCT 35.4 (L) 10/02/2019   MCV 81.0 10/02/2019   PLT 479 (H) 10/02/2019     STUDIES: No results found.  ASSESSMENT: Left renal mass, weight loss.  PLAN:   1. Left renal mass: CT scan results from September 11, 2019 reviewed independently and reported as above with a 2.9 cm enhancing lesion on the left kidney compatible with renal cell carcinoma.  Patient has been seen by urology and plan is simple observation. 2.  Weight loss: Likely multifactorial.  Abdominal imaging as above, patient also had a CT of the chest on September 18, 2019 that did not reveal any other significant pathology.  Patient continues to have difficulty urinating and is following up with urology for this.  He also has intermittent constipation which decreases his appetite.  Finally, patient also has some mild esophageal dysmotility.  I have ordered thyroid panel with TSH today for completeness.  No intervention is  needed at this time. 3.  Constipation: Continue stool softeners daily and MiraLAX as needed. 4.  Urinary retention: Patient reports he has a procedure with urology in the next several weeks. 5.  Disposition: Return to clinic in 3 months with repeat laboratory can further evaluation.  Patient expressed understanding and was in agreement with this plan. He also understands that He can call clinic at any time with any questions, concerns, or complaints.    Lloyd Huger, MD   12/29/2019 7:02 AM

## 2020-01-04 ENCOUNTER — Encounter: Payer: Self-pay | Admitting: Oncology

## 2020-01-04 ENCOUNTER — Inpatient Hospital Stay: Payer: Federal, State, Local not specified - PPO | Attending: Oncology | Admitting: Oncology

## 2020-02-09 ENCOUNTER — Other Ambulatory Visit: Payer: Self-pay | Admitting: Pulmonary Disease

## 2020-02-09 DIAGNOSIS — R0602 Shortness of breath: Secondary | ICD-10-CM

## 2020-02-13 ENCOUNTER — Ambulatory Visit
Admission: RE | Admit: 2020-02-13 | Discharge: 2020-02-13 | Disposition: A | Discharge status: Home / Self Care | Payer: Medicare Other | Source: Ambulatory Visit | Attending: Pulmonary Disease | Admitting: Pulmonary Disease

## 2020-02-13 ENCOUNTER — Other Ambulatory Visit: Payer: Self-pay

## 2020-02-13 DIAGNOSIS — I2510 Atherosclerotic heart disease of native coronary artery without angina pectoris: Secondary | ICD-10-CM | POA: Diagnosis not present

## 2020-02-13 DIAGNOSIS — I700 Atherosclerosis of aorta: Secondary | ICD-10-CM | POA: Insufficient documentation

## 2020-02-13 DIAGNOSIS — J8410 Pulmonary fibrosis, unspecified: Secondary | ICD-10-CM | POA: Diagnosis not present

## 2020-02-13 DIAGNOSIS — R0602 Shortness of breath: Secondary | ICD-10-CM

## 2020-02-13 DIAGNOSIS — I517 Cardiomegaly: Secondary | ICD-10-CM | POA: Insufficient documentation

## 2020-02-13 DIAGNOSIS — N200 Calculus of kidney: Secondary | ICD-10-CM | POA: Insufficient documentation

## 2020-02-13 MED ORDER — IOHEXOL 300 MG/ML  SOLN
100.0000 mL | Freq: Once | INTRAMUSCULAR | Status: AC | PRN
  Administered 2020-02-13: 100 mL via INTRAVENOUS

## 2020-02-25 IMAGING — CT CT CHEST W/ CM
2 of 4 series · 15 of 36 positions shown, 18 images · IV contrast (omnipaque)
Comparison: Esophagram and upper GI series dated 09/07/2019 and
chest CTA dated 08/24/2019.

CLINICAL DATA: Follow-up mass effect on the upper esophagus due to
a prominent aortic arch seen on a recent esophagram.

EXAM:
CT CHEST WITH CONTRAST
TECHNIQUE: Multidetector CT imaging of the chest was performed during
intravenous contrast administration.
CONTRAST:  60mL OMNIPAQUE IOHEXOL 300 MG/ML  SOLN

[Series 2: axial chest · axial · 0.62mm/px · z∈[-1194,-914]mm · 12 of 166 slices shown, 15 images]
[im 13/166  mediastinal]
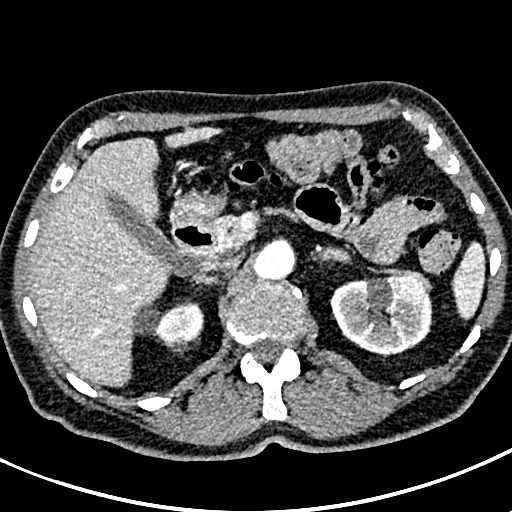
[im 13/166  lung]
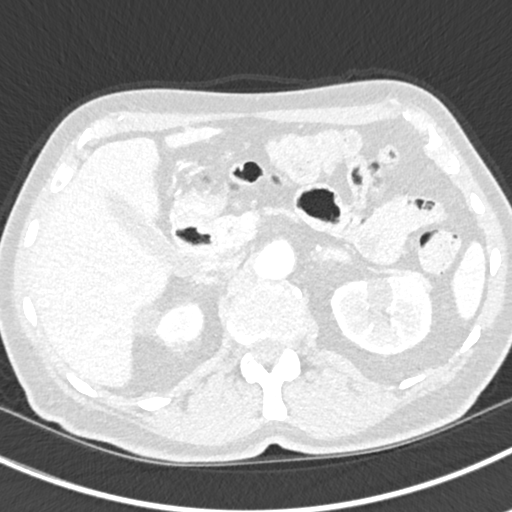
[im 26/166  lung]
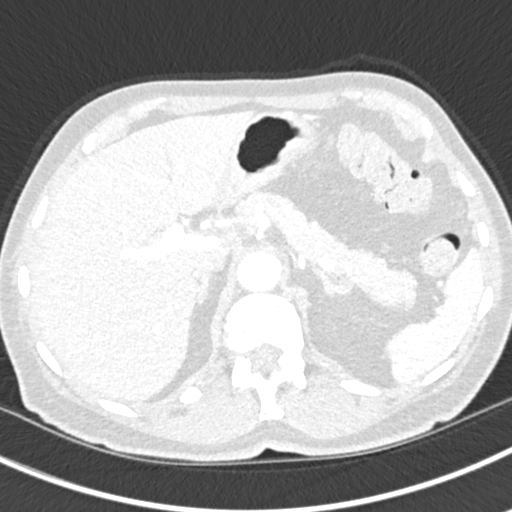
[im 39/166  lung]
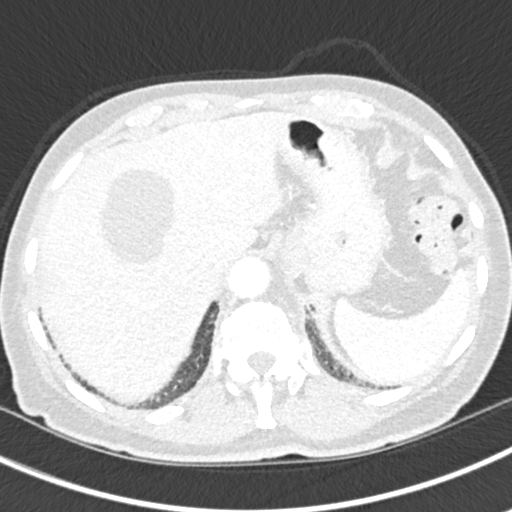
[im 51/166  lung]
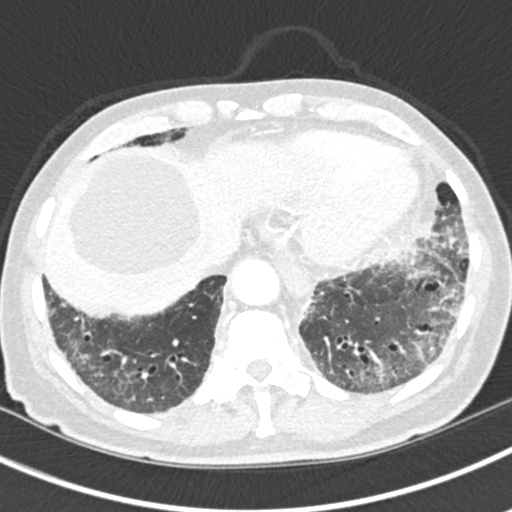
[im 64/166  mediastinal]
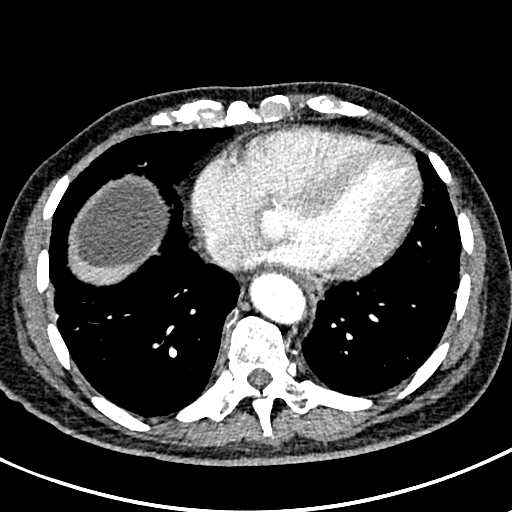
[im 64/166  lung]
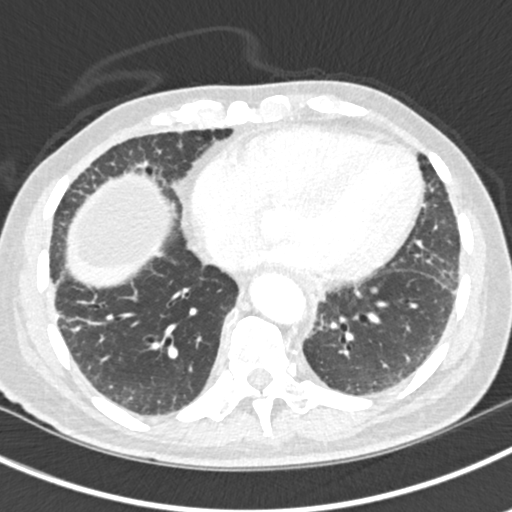
[im 77/166  lung]
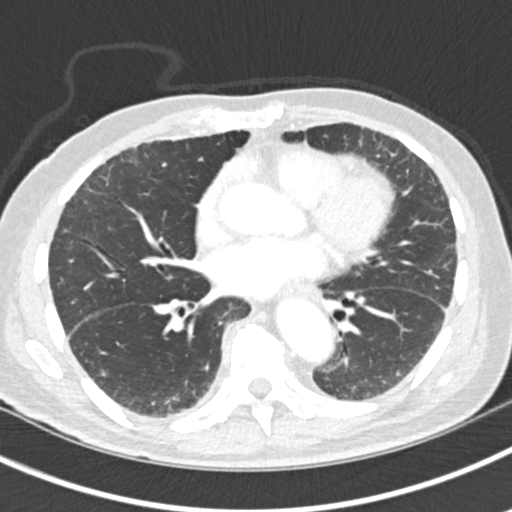
[im 89/166  lung]
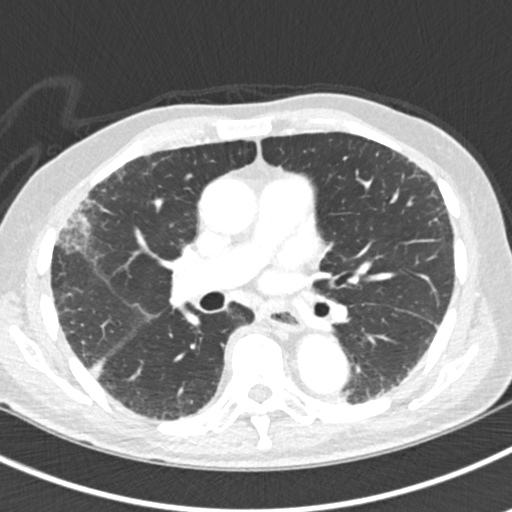
[im 102/166  lung]
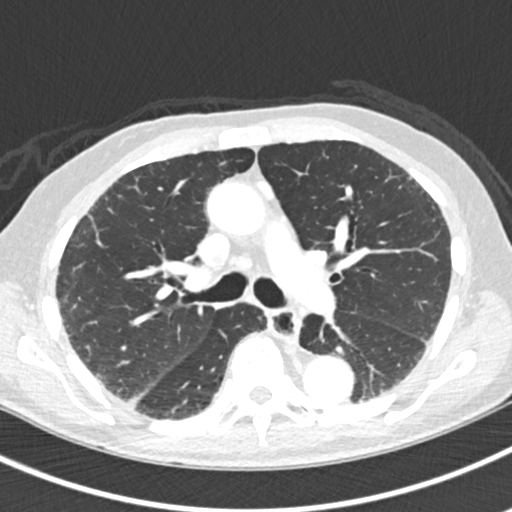
[im 115/166  mediastinal]
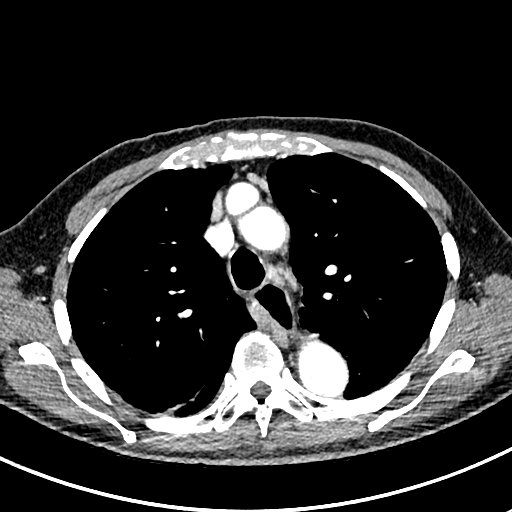
[im 115/166  lung]
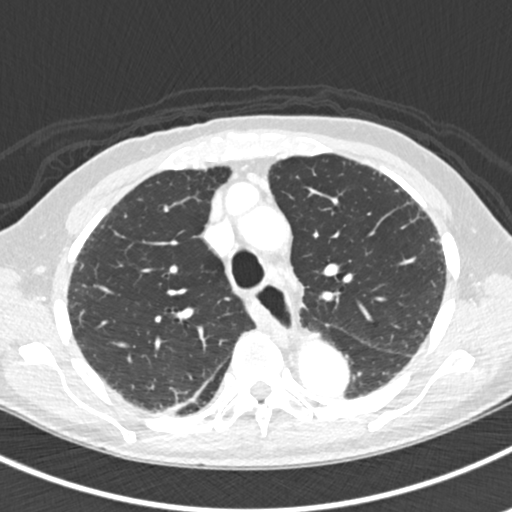
[im 127/166  lung]
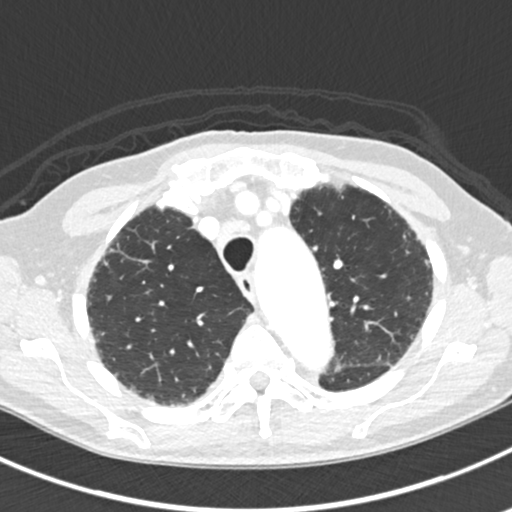
[im 140/166  lung]
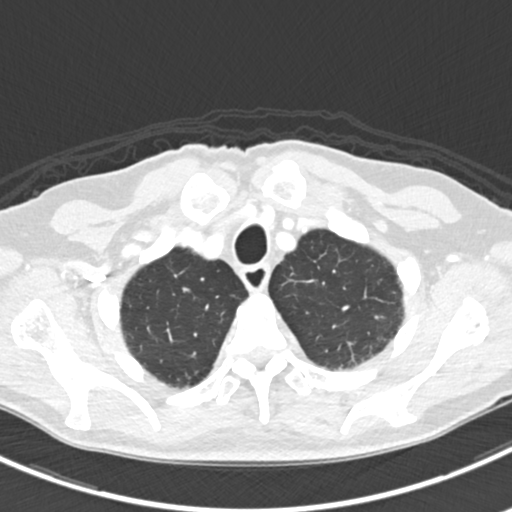
[im 153/166  lung]
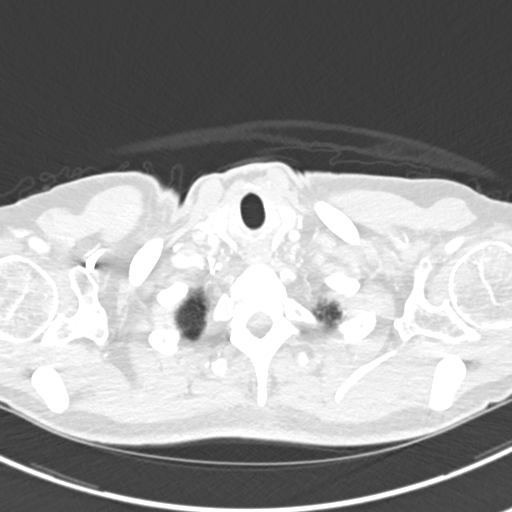

[Series 4: coronal chest · coronal · 0.62mm/px · 3 of 153 slices shown]
[im 31/153  lung]
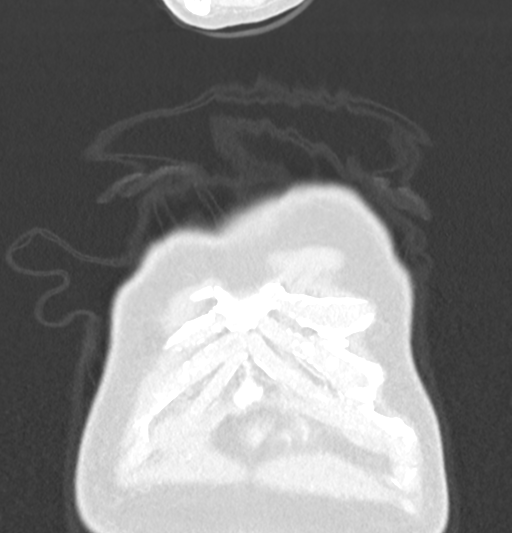
[im 61/153  lung]
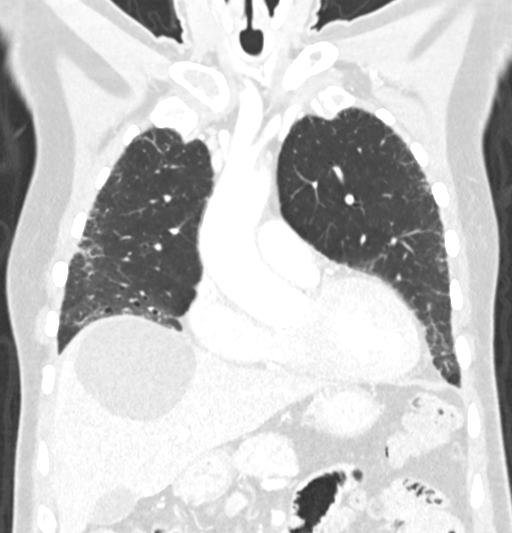
[im 92/153  lung]
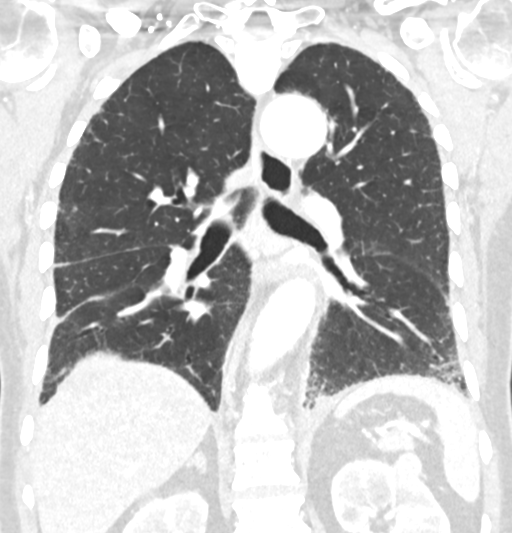

[15 of 36 positions shown; findings below may reference images not displayed]

FINDINGS: Cardiovascular: Again demonstrated is mild dilatation of the aortic
arch, continuing measure 4.0 cm in maximum diameter. The ascending
and descending thoracic aorta are normal in caliber. The heart is
mildly enlarged with a decrease in minimal pericardial fluid with a
minimal amount remaining, measuring 5 mm in maximum thickness.
Atheromatous coronary artery calcifications. The left common carotid
artery is arising from a common trunk with the innominate artery.

Mediastinum/Nodes: No enlarged mediastinal, hilar, or axillary lymph
nodes. Thyroid gland, trachea, and esophagus demonstrate no
significant findings.

Lungs/Pleura: Mild bilateral peripheral fibrosis is unchanged.
Bilateral lower lobe and right middle lobe cylindrical
bronchiectasis. Centrilobular bulla in the left lower lobe. No
pleural fluid.

Upper Abdomen: Exophytic, solid, mildly heterogeneously enhancing
mass arising from the anterolateral aspect of the upper pole of the
left kidney. This measures 2.4 x 2.3 cm on image number 149 series 2
s and 2.4 cm in length on coronal image number 87. Stable large cyst
in the dome of the liver on the right. Bilateral renal cysts.

Musculoskeletal: Thoracic and lower cervical spine degenerative
changes.
IMPRESSION: 1. 2.4 x 2.4 x 2.3 cm solid upper pole left renal mass, suspicious
for a renal cell carcinoma.
2. Stable aneurysmal dilatation of the aortic arch measuring 4.0 cm
in maximum diameter. Recommend semi-annual imaging followup by CTA
or MRA and referral to cardiothoracic surgery if not already
obtained. This recommendation follows 8525
ACCF/AHA/AATS/ACR/ASA/SCA/EBADAT/KIRKLAND/JHEMBOY/RTOYOTA Guidelines for the
Diagnosis and Management of Patients With Thoracic Aortic Disease.
Circulation. 8525; 121: E266-e36. Aortic aneurysm NOS (5OSGP-2DG.B)
3. Stable mild cardiomegaly with a decrease in minimal pericardial
fluid.
4. Mild bilateral peripheral pulmonary fibrosis.
5. Bilateral lower lobe and right middle lobe cylindrical
bronchiectasis.
6. Atheromatous coronary artery calcifications.

These results will be called to the ordering clinician or
representative by the Radiologist Assistant, and communication
documented in the PACS or zVision Dashboard.

Emphysema (5OSGP-HH4.6).

## 2020-02-27 ENCOUNTER — Ambulatory Visit: Payer: Medicare HMO | Admitting: Urology

## 2020-03-06 ENCOUNTER — Other Ambulatory Visit: Payer: Self-pay

## 2020-03-06 ENCOUNTER — Ambulatory Visit (INDEPENDENT_AMBULATORY_CARE_PROVIDER_SITE_OTHER): Payer: Medicare Other | Admitting: Family

## 2020-03-06 ENCOUNTER — Encounter: Payer: Self-pay | Admitting: Family

## 2020-03-06 VITALS — BP 120/80 | HR 96 | Ht 68.0 in | Wt 150.1 lb

## 2020-03-06 DIAGNOSIS — I350 Nonrheumatic aortic (valve) stenosis: Secondary | ICD-10-CM

## 2020-03-06 DIAGNOSIS — I10 Essential (primary) hypertension: Secondary | ICD-10-CM

## 2020-03-06 DIAGNOSIS — Q254 Congenital malformation of aorta unspecified: Secondary | ICD-10-CM | POA: Diagnosis not present

## 2020-03-06 DIAGNOSIS — K219 Gastro-esophageal reflux disease without esophagitis: Secondary | ICD-10-CM | POA: Diagnosis not present

## 2020-03-06 NOTE — Patient Instructions (Addendum)
Medication Instructions:  No medication changes today.   *If you need a refill on your cardiac medications before your next appointment, please call your pharmacy*   Lab Work: No lab work today. Recent lab work in February looks good!  If you have labs (blood work) drawn today and your tests are completely normal, you will receive your results only by: Marland Kitchen MyChart Message (if you have MyChart) OR . A paper copy in the mail If you have any lab test that is abnormal or we need to change your treatment, we will call you to review the results.   Testing/Procedures: Your EKG today showed normal sinus rhythm which is a good result! You have an occasional early beat in the top or bottom chamber of your heart called a PAC or PVC. This is not dangerous. It sometimes feels like your heart "jumps" or skips a beat.   Follow-Up: At [facility], you and your health needs are our priority.  As part of our continuing mission to provide you with exceptional heart care, we have created designated Provider Care Teams.  These Care Teams include your primary Cardiologist (physician) and Advanced Practice Providers (APPs -  Physician Assistants and Nurse Practitioners) who all work together to provide you with the care you need, when you need it.  We recommend signing up for the patient portal called "MyChart".  Sign up information is provided on this After Visit Summary.  MyChart is used to connect with patients for Virtual Visits (Telemedicine).  Patients are able to view lab/test results, encounter notes, upcoming appointments, etc.  Non-urgent messages can be sent to your provider as well.   To learn more about what you can do with MyChart, go to [email or web address].    Your next appointment:   6 month(s)  The format for your next appointment:   In Person  Provider:   You may see Ida Rogue, MD or one of the following Advanced Practice Providers on your designated Care Team:    Murray Hodgkins, NP  Christell Faith, PA-C  Marrianne Mood, PA-C  Other Instructions  Continue to follow with cardiothoracic surgery (Dr. Silvio Pate) for your aortic arch dilation. Your recent echocardiogram (ultrasound of your heart) shows normal pumping function and mild stiffening of your aortic valve. This is a stable result. Please call our office if you have chest pain, worsening shortness of breath, or episodes of passing out.   Your blood pressure looks great! Our goal is for your blood pressure to be less than 130/80. If your blood pressure is consistently more than this, please call us or your primary care provider.   Recommend trying over the counter Claritin (Loratidine) or Zyrtec (Cetirizine) at bedtime as needed for allergy symptoms.   Recommend using a peppermint or sugar free candy as needed for dry mouth.

## 2020-03-06 NOTE — Progress Notes (Signed)
Office Visit    Patient Name: James Oconnor Date of Encounter: 03/06/2020  Primary Care Provider:  Dion Body, MD Primary Cardiologist:  Ida Rogue, MD Electrophysiologist:  None   Chief Complaint    James Oconnor is a 86 y.o. male with a hx of aortic arch dilation of 4cm by CTA 08/2019, probable RCC, bladder outlet obstruction, Dm2, HTN, dysphagia, ILD, anemia, renal stones presents today for 20-month follow-up of aortic arch dilation and HTN  Past Medical History    Past Medical History:  Diagnosis Date  . BPH (benign prostatic hyperplasia)   . Hypertension   . Overactive bladder   . Prostate enlargement   . UTI (urinary tract infection)    Past Surgical History:  Procedure Laterality Date  . COLONOSCOPY WITH PROPOFOL N/A 11/30/2019   Procedure: COLONOSCOPY WITH PROPOFOL;  Surgeon: Toledo, Benay Pike, MD;  Location: [facility] ENDOSCOPY;  Service: Gastroenterology;  Laterality: N/A;  . ESOPHAGOGASTRODUODENOSCOPY (EGD) WITH PROPOFOL N/A 11/30/2019   Procedure: ESOPHAGOGASTRODUODENOSCOPY (EGD) WITH PROPOFOL;  Surgeon: Toledo, Benay Pike, MD;  Location: [facility] ENDOSCOPY;  Service: Gastroenterology;  Laterality: N/A;  . HOLEP-LASER ENUCLEATION OF THE PROSTATE WITH MORCELLATION N/A 10/13/2019   Procedure: HOLEP-LASER ENUCLEATION OF THE PROSTATE WITH MORCELLATION;  Surgeon: Billey Co, MD;  Location: [facility] ORS;  Service: Urology;  Laterality: N/A;  . NO PAST SURGERIES    . none      Allergies  Allergies  Allergen Reactions  . Oxybutynin     History of Present Illness    James Oconnor is a 86 y.o. male with a hx of aortic arch dilation of 4cm by CTA 08/2019, probable RCC, bladder outlet obstruction, Dm2, HTN, dysphagia, ILD, anemia, renal stones last seen 09/28/19 by Christell Faith, PA.  Admitted late 08/2019 subjective fevers, cough, dysphagia, mild left-sided discomfort.  Cardiology asked to evaluate.  High-sensitivity troponin I 05 with delta of 80.  Chest x-ray with mild  cardiomegaly with trace bilateral pleural effusions, atelectasis, aortic atherosclerosis.  Echo with normal LV SF with EF 60-65%, mild LVH, DD, normal RVSF and cavity size, mild MR.  CTA chest negative for PE with minimal coronary artery calcification and mild aortic atherosclerosis.  Incidentally, left renal mass noted with follow-up MRI highly concerning for RCC.  Symptoms consistent with demand ischemia in setting possible bronchitis and no further inpatient cardiac work-up advised. As his exercise tolerance was >4METS and no RWMA with normal LVEF on echo, outpatient ischemic evaluation was not recommended.   His left renal mass remains under observation by urology.  No plans for biopsy at this time per primary care's notes.  He is seen cardiothoracic surgery at [facility] in regards to dilated aortic root arch with recommendation for follow-up imaging. Lisinopril was previously discontinued due to hypotension.  Seen by [facility] pulmonology 01/2020.  Has upcoming follow-up with them.  Additionally follows with gastroenterology at [facility].  Reports no chest pain, pressure, tightness.  Reports no shortness of breath at rest.  Does endorse some dyspnea on exertion.  He just graduated from home physical therapy this week.  Endorses some fatigue over the last couple of days but tells me energy level prior to this was improved. We discussed deconditioning and that it may take time for him to recuperate. Encouraged continued PT exercises at home.   He had recent echo 02/08/2020 at [facility].  He also had CT scan 02/13/2020.  Unfortunately CT is unavailable for my review.  Echo 02/08/2020 with LVEF 55%, mild AV stenosis and regurgitation,  normal RV systolic function.  EKGs/Labs/Other Studies Reviewed:   The following studies were reviewed today: 2D echo 08/25/2019: 1. Left ventricular ejection fraction, by visual estimation, is 60 to 65%. The left ventricle has normal function. Normal left ventricular size. There is mildly  increased left ventricular hypertrophy.  2. Left ventricular diastolic Doppler parameters are consistent with impaired relaxation pattern of LV diastolic filling.  3. Global right ventricle has normal systolic function.The right ventricular size is normal. No increase in right ventricular wall thickness.  4. Left atrial size was normal.  5. TR signal is inadequate for assessing pulmonary artery systolic pressure.  6. The inferior vena cava is normal in size with greater than 50% respiratory variability, suggesting right atrial pressure of 3 mmHg.  EKG:  EKG is ordered today.  The ekg ordered today demonstrates SR 96 bpm with one PAC and 1 PAC - no acute ST/T wave changes.  Recent Labs: 10/02/2019: ALT 33; BUN 19; Creatinine, Ser 1.21; Hemoglobin 11.6; Platelets 479; Potassium 4.5; Sodium 136; TSH 1.760  Recent Lipid Panel    Component Value Date/Time   CHOL 144 08/25/2019 0641   TRIG 75 08/25/2019 0641   HDL 55 08/25/2019 0641   CHOLHDL 2.6 08/25/2019 0641   VLDL 15 08/25/2019 0641   LDLCALC 74 08/25/2019 0641    Home Medications   Current Meds  Medication Sig  . mirtazapine (REMERON) 7.5 MG tablet Take 7.5 mg by mouth at bedtime.  Marland Kitchen omeprazole (PRILOSEC) 20 MG capsule Take 20 mg by mouth daily.  . psyllium (METAMUCIL) 58.6 % packet Take 1 packet by mouth daily.    Review of Systems    Review of Systems  Constitution: Positive for malaise/fatigue. Negative for chills and fever.  Cardiovascular: Positive for dyspnea on exertion. Negative for chest pain, irregular heartbeat, leg swelling, near-syncope, orthopnea, palpitations and syncope.  Respiratory: Positive for cough. Negative for shortness of breath and wheezing.   Gastrointestinal: Negative for melena, nausea and vomiting.  Genitourinary: Negative for hematuria.  Neurological: Negative for dizziness, light-headedness and weakness.   All other systems reviewed and are otherwise negative except as noted above.  Physical  Exam    VS:  BP 120/80 (BP Location: Left Arm, Patient Position: Sitting, Cuff Size: Normal)   Pulse 96   Ht 5\' 8"  (1.727 m)   Wt 150 lb 2 oz (68.1 kg)   SpO2 94%   BMI 22.83 kg/m  , BMI Body mass index is 22.83 kg/m. GEN: Well nourished, thin, well developed, in no acute distress. HEENT: normal. Neck: Supple, no JVD, carotid bruits, or masses. Cardiac: RRR, no murmurs, rubs, or gallops. No clubbing, cyanosis, edema.  Radials/DP/PT 2+ and equal bilaterally.  Respiratory:  Respirations regular and unlabored, clear to auscultation bilaterally. GI: Soft, nontender, nondistended, BS + x 4. MS: No deformity or atrophy. Skin: Warm and dry, no rash. Neuro:  Strength and sensation are intact. Psych: Normal affect.  Accessory Clinical Findings    ECG personally reviewed by me today - SR 96 bpm with single PAC and PVC - no acute changes.  Assessment & Plan    1. Dilated aortic arch -incidentally noted on imaging 08/2019 with radiology recommendation for seeing Estill Bakes will follow up with CTA or MRI and referral to cardiothoracic surgery.  Stable on repeat imaging mid 08/2019.  Recommendation for optimal BP, heart rate, lipid control.  Avoid fluoroquinolones and heavy lifting.  He has established cardiothoracic surgery at [facility].  2. Mild AS - Noted by echo  01/2020 available in Care Everywhere, LVEF normal. Asymptomatic with no chest pain, near-syncope. Endorses intermittent DOE. Educated to report new or worsening symptoms.  3. Hx elevated high-sensitivity troponin - During hospitalization 08/2019.  In the setting of recent bronchitis and subsequent echo demonstrated preserved LVEF with normal wall motion. No recurrent anginal symptoms. No indication for ischemic evaluation at this time.   4. HTN - BP well controlled today. Checks regularly at home. No indication to resume Lisinopril at this time. Reports no orthostatic symptoms.   5. GERD/Dysphagia - Follows with GI. Well controlled on Omeprzole  20mg  daily.   6. Cough - Etiology postnasal drip vs GERD. Recommended continue Muccinex and water to clear. Recommend OTC Zyrtec/Claritin at bedtime for allergy symptoms. Encouraged to follow up with PCP if does not improve.  Disposition: Follow up in 6 month(s) with Dr. Rockey Situ or APP.   Loel Dubonnet, NP 03/06/2020, 2:18 PM

## 2020-03-08 NOTE — Addendum Note (Signed)
Addended by: Raelene Bott, Fronnie Urton L on: 03/08/2020 08:33 AM   Modules accepted: Orders

## 2020-03-15 ENCOUNTER — Encounter: Payer: Self-pay | Admitting: Oncology

## 2020-03-15 ENCOUNTER — Inpatient Hospital Stay: Payer: Medicare Other | Attending: Oncology | Admitting: Oncology

## 2020-03-15 ENCOUNTER — Other Ambulatory Visit: Payer: Self-pay

## 2020-03-15 VITALS — BP 130/92 | HR 94 | Temp 96.7°F | Resp 20 | Wt 148.8 lb

## 2020-03-15 DIAGNOSIS — R634 Abnormal weight loss: Secondary | ICD-10-CM | POA: Diagnosis not present

## 2020-03-15 DIAGNOSIS — N2889 Other specified disorders of kidney and ureter: Secondary | ICD-10-CM | POA: Diagnosis not present

## 2020-03-15 DIAGNOSIS — R338 Other retention of urine: Secondary | ICD-10-CM | POA: Diagnosis not present

## 2020-03-15 DIAGNOSIS — K5900 Constipation, unspecified: Secondary | ICD-10-CM | POA: Diagnosis not present

## 2020-03-15 NOTE — Progress Notes (Signed)
[place]  [phone] [phone]  ID: James Oconnor OB: [birth year]  MR#: [id number]  [id number]  Patient Care Team: Dion Body, MD as PCP - General (Family Medicine) Minna Merritts, MD as PCP - Cardiology (Cardiology)  CHIEF COMPLAINT: Left renal mass, weight loss.  INTERVAL HISTORY: Patient returns to clinic today for further evaluation and discussion of his imaging results.  Despite extensive work-up which included CT scan, colonoscopy, and endoscopy per patient continues to lose weight.  He otherwise feels well.  He has no neurologic complaints.  He denies any recent fevers or illnesses.  He has a fair appetite.  He has no chest pain, shortness of breath, cough, or hemoptysis.  He denies any nausea, vomiting, constipation, or diarrhea.  He has no urinary complaints.  Patient offers no further specific complaints today.  REVIEW OF SYSTEMS:   Review of Systems  Constitutional: Positive for weight loss. Negative for fever and malaise/fatigue.  Respiratory: Negative for cough and shortness of breath.   Cardiovascular: Negative.  Negative for chest pain and leg swelling.  Gastrointestinal: Negative.  Negative for abdominal pain and constipation.  Genitourinary: Negative.  Negative for frequency and urgency.  Musculoskeletal: Negative.  Negative for back pain.  Skin: Negative.  Negative for rash.  Neurological: Negative.  Negative for dizziness, focal weakness, weakness and headaches.  Psychiatric/Behavioral: Negative.  The patient is not nervous/anxious.     As per HPI. Otherwise, a complete review of systems is negative.  PAST MEDICAL HISTORY: Past Medical History:  Diagnosis Date  . BPH (benign prostatic hyperplasia)   . Hypertension   . Overactive bladder   . Prostate enlargement   . UTI (urinary tract infection)     PAST SURGICAL HISTORY: Past Surgical History:  Procedure Laterality Date  . COLONOSCOPY WITH PROPOFOL  N/A 11/30/2019   Procedure: COLONOSCOPY WITH PROPOFOL;  Surgeon: Toledo, Benay Pike, MD;  Location: [facility] END[facility]COPY;  Service: Gastroenterology;  Laterality: N/A;  . ESOPHAGOGASTRODUODENOSCOPY (EGD) WITH PROPOFOL N/A 11/30/2019   Procedure: ESOPHAGOGASTRODUODENOSCOPY (EGD) WITH PROPOFOL;  Surgeon: Toledo, Benay Pike, MD;  Location: [facility] END[facility];  Service: Gastroenterology;  Laterality: N/A;  . HOLEP-LASER ENUCLEATION OF THE PROSTATE WITH MORCELLATION N/A 10/13/2019   Procedure: HOLEP-LASER ENUCLEATION OF THE PROSTATE WITH MORCELLATION;  Surgeon: Billey Co, MD;  Location: [facility] ORS;  Service: Urology;  Laterality: N/A;  . NO PAST SURGERIES    . none      FAMILY HISTORY: Family History  Problem Relation Age of Onset  . Lung cancer Father   . Heart disease Sister   . Lung cancer Brother   . Throat cancer Brother     ADVANCED DIRECTIVES (Y/N):  N  HEALTH MAINTENANCE: Social History   Tobacco Use  . Smoking status: Former Smoker    Years: 60.00    Types: Cigarettes    Quit date: 1957    Years since quitting: 64.3  . Smokeless tobacco: Never Used  . Tobacco comment: quit smoking 4yrs ago  Substance Use Topics  . Alcohol use: Not Currently  . Drug use: Not Currently     Colonoscopy:  PAP:  Bone density:  Lipid panel:  Allergies  Allergen Reactions  . Oxybutynin     Current Outpatient Medications  Medication Sig Dispense Refill  . mirtazapine (REMERON) 7.5 MG tablet Take 7.5 mg by mouth at bedtime.    Marland Kitchen omeprazole (PRILOSEC) 20 MG capsule Take 20 mg by mouth daily.    . psyllium (METAMUCIL) 58.6 % packet  Take 1 packet by mouth daily.     No current facility-administered medications for this visit.    OBJECTIVE: Vitals:   03/15/20 1037  BP: (!) 130/92  Pulse: 94  Resp: 20  Temp: (!) 96.7 F (35.9 C)  SpO2: 100%     Body mass index is 22.62 kg/m.    ECOG FS:1 - Symptomatic but completely ambulatory  General: Thin, no acute distress. Eyes: Pink  conjunctiva, anicteric sclera. HEENT: Normocephalic, moist mucous membranes. Lungs: No audible wheezing or coughing. Heart: Regular rate and rhythm. Abdomen: Soft, nontender, no obvious distention. Musculoskeletal: No edema, cyanosis, or clubbing. Neuro: Alert, answering all questions appropriately. Cranial nerves grossly intact. Skin: No rashes or petechiae noted. Psych: Normal affect.   LAB RESULTS:  Lab Results  Component Value Date   NA 136 10/02/2019   K 4.5 10/02/2019   CL 102 10/02/2019   CO2 22 10/02/2019   GLUCOSE 98 10/02/2019   BUN 19 10/02/2019   CREATININE 1.21 10/02/2019   CALCIUM 8.4 (L) 10/02/2019   PROT 7.6 10/02/2019   ALBUMIN 3.0 (L) 10/02/2019   AST 39 10/02/2019   ALT 33 10/02/2019   ALKPHOS 52 10/02/2019   BILITOT 0.4 10/02/2019   GFRNONAA 54 (L) 10/02/2019   GFRAA >60 10/02/2019    Lab Results  Component Value Date   WBC 9.3 10/02/2019   NEUTROABS 5.3 10/02/2019   HGB 11.6 (L) 10/02/2019   HCT 35.4 (L) 10/02/2019   MCV 81.0 10/02/2019   PLT 479 (H) 10/02/2019     STUDIES: No results found.  ASSESSMENT: Left renal mass, weight loss.  PLAN:   1. Left renal mass: Patient's most recent CT scan on February 13, 2020 reviewed independently with essentially stable left renal mass measuring approximately 2.4 cm.  He continues to follow closely with urology.  Continue simple observation and repeat CT scan in 6 months.   2.  Weight loss: Patient has had complete work-up including CT of the chest, abdomen, pelvis, colonoscopy, and EGD with no significant pathology.  He reports a fair appetite.  Have sent a referral to dietary for further evaluation.   3.  Constipation: Patient does not complain of this today.  Continue stool softeners daily and MiraLAX as needed. 4.  Urinary retention: Continue follow-up with urology as scheduled. 5.  Disposition: Return to clinic in 3 months with repeat laboratory can further evaluation.  Patient expressed  understanding and was in agreement with this plan. He also understands that He can call clinic at any time with any questions, concerns, or complaints.    Lloyd Huger, MD   03/15/2020 9:35 PM

## 2020-03-15 NOTE — Progress Notes (Signed)
Patient is here today for follow up. States he is still losing weight. Denies any pain. States he does have some shortness of breath since October 2020.

## 2020-04-01 ENCOUNTER — Inpatient Hospital Stay: Payer: Medicare Other | Attending: Oncology

## 2020-04-01 ENCOUNTER — Other Ambulatory Visit: Payer: Self-pay

## 2020-04-01 NOTE — Telephone Encounter (Signed)
Error

## 2020-04-01 NOTE — Progress Notes (Signed)
Nutrition Assessment   Reason for Assessment:  Referral for weight loss   ASSESSMENT:  86 year old male with left renal mass.  Past medical history of HTN, prostate enlargement, overactive bladder.  Patient under observation of left renal mass with Dr. Grayland Ormond.    Met with patient and daughter Rodena Piety today in clinic.  Patient reports appetite has improved after starting megace.  Typically eats breakfast around 11-11:30am of oatmeal and blueberries.  Lunch is around 1:30-3pm and may have sandwich or chicken pot pie.  Dinner is around 7pm and is meat and vegetables (yesterday chicken leg and thigh, green beans corn on cob, salad and green beans.  Patient typically drinks boost shake per day. Daughter recently purchased boost very high calorie drink (530 calories 22 g protein).  Patient denies trouble chewing and swallowing.  Does have reflux but controlled with prilosec and chewing foods well per patient.  Does reports some mild constipation and takes miralax.     Medications: megace, prilosec   Labs: no recent   Anthropometrics:   Height: 68 inches Weight: 142 lb 7 oz today 148 lb 12.8 oz on 4/16 UBW: 190 lb in October of 2019 Per chart note 190 lb in Jan 2020 Jan 2021 160 lb BMI: 22  11% weight loss in the last 4 months, concerning   Estimated Energy Needs  Kcals: 2000-2300 Protein: 100-115 g Fluid: > 2 L   NUTRITION DIAGNOSIS: Unintentional weight loss related to reflux/pulmonary issues as evidenced by 11% weight loss in the last 4 months   INTERVENTION:  Agree with trial of appetite stimulant. Discussed ways to add calories and protein into diet.  Handout on High Calorie, High protein foods and recipes from AND provided.  Cautioned patient on monitoring reflux with adding higher fat foods to diet.   Discussed high calorie oral nutrition supplements.  Continue [facility] Boost shake 1-2 times per day.  Samples of shakes provided to patient to try (boost/ensure, orgain, [employer], boost soothe) Patient provided with contact information   MONITORING, EVALUATION, GOAL: weight trends, intake   Next Visit: June 14th in clinic  [place]. Zenia Resides, [place], [place] Registered Dietitian [phone] (pager)

## 2020-05-13 ENCOUNTER — Other Ambulatory Visit: Payer: Self-pay

## 2020-05-13 ENCOUNTER — Inpatient Hospital Stay: Payer: Medicare Other | Attending: Oncology

## 2020-05-13 NOTE — Progress Notes (Signed)
Nutrition Follow-up:  Patient with left renal mass.  Under observation of left renal mass with Dr. Grayland Ormond.    Met with patient, daughter Rodena Piety and wife for nutrition follow-up.  Patient with improved appetite. Wife reports that he is hungry.  Patient has been eating mostly 3 meals per day. Drinking [facility] boost shake a day and ensure enlive a day.  Reports bowels are moving, no nausea. Reports reflux is better.     Medications: megace, prilosec  Labs: no new  Anthropometrics:   Weight today 148 lb increased from 142 lb 7 oz on 04/01/20   NUTRITION DIAGNOSIS: Unintentional weight loss improved   INTERVENTION:  Patient to continue to eat high calorie, high protein foods.  Family concerned about elevated cholesterol.  Discussed heart healthy fats.   Encouraged patient to continue high calorie shakes for added nutrition Patient has been working with PT. Encouraged exercises that PT has assigned to improve strength.   Patient and family has contact information and will contact RD if needed in the future    NEXT VISIT: No follow-up.  Patient and family to contact RD if needed   Davy Westmoreland B. Zenia Resides, [place], [place] Registered Dietitian [phone] (pager)

## 2020-06-15 NOTE — Progress Notes (Signed)
[place]  Telephone:([phone] Fax:(336) [phone]  ID: James Oconnor OB: 05-31-1930  MR#: [id number]  XAJ#:[id number]  Patient Care Team: James Body, MD as PCP - General (Family Medicine) Minna Merritts, MD as PCP - Cardiology (Cardiology)  CHIEF COMPLAINT: Left renal mass, weight loss.  INTERVAL HISTORY: Patient returns to clinic today for repeat laboratory work and further evaluation. He continues to have chronic weakness and fatigue, but his appetite has improved and has gained weight in the interim. He has no neurologic complaints.  He denies any recent fevers or illnesses.  He has no chest pain, shortness of breath, cough, or hemoptysis.  He denies any nausea, vomiting, constipation, or diarrhea.  He has no urinary complaints. Patient offers no further specific complaints today.  REVIEW OF SYSTEMS:   Review of Systems  Constitutional: Positive for malaise/fatigue. Negative for fever and weight loss.  Respiratory: Negative for cough and shortness of breath.   Cardiovascular: Negative.  Negative for chest pain and leg swelling.  Gastrointestinal: Negative.  Negative for abdominal pain and constipation.  Genitourinary: Negative.  Negative for frequency and urgency.  Musculoskeletal: Negative.  Negative for back pain.  Skin: Negative.  Negative for rash.  Neurological: Positive for weakness. Negative for dizziness, focal weakness and headaches.  Psychiatric/Behavioral: Negative.  The patient is not nervous/anxious.     As per HPI. Otherwise, a complete review of systems is negative.  PAST MEDICAL HISTORY: Past Medical History:  Diagnosis Date  . BPH (benign prostatic hyperplasia)   . Hypertension   . Overactive bladder   . Prostate enlargement   . UTI (urinary tract infection)     PAST SURGICAL HISTORY: Past Surgical History:  Procedure Laterality Date  . COLONOSCOPY WITH PROPOFOL N/A 11/30/2019   Procedure: COLONOSCOPY WITH PROPOFOL;   Surgeon: Toledo, Benay Pike, MD;  Location: [facility];  Service: Gastroenterology;  Laterality: N/A;  . ESOPHAGOGASTRODUODENOSCOPY (EGD) WITH PROPOFOL N/A 11/30/2019   Procedure: ESOPHAGOGASTRODUODENOSCOPY (EGD) WITH PROPOFOL;  Surgeon: Toledo, Benay Pike, MD;  Location: [facility];  Service: Gastroenterology;  Laterality: N/A;  . HOLEP-LASER ENUCLEATION OF THE PROSTATE WITH MORCELLATION N/A 10/13/2019   Procedure: HOLEP-LASER ENUCLEATION OF THE PROSTATE WITH MORCELLATION;  Surgeon: Billey Co, MD;  Location: [facility] ORS;  Service: Urology;  Laterality: N/A;  . NO PAST SURGERIES    . none      FAMILY HISTORY: Family History  Problem Relation Age of Onset  . Lung cancer Father   . Heart disease Sister   . Lung cancer Brother   . Throat cancer Brother     ADVANCED DIRECTIVES (Y/N):  N  HEALTH MAINTENANCE: Social History   Tobacco Use  . Smoking status: Former Smoker    Years: 60.00    Types: Cigarettes    Quit date: 1957    Years since quitting: 64.5  . Smokeless tobacco: Never Used  . Tobacco comment: quit smoking 25yrs ago  Vaping Use  . Vaping Use: Never used  Substance Use Topics  . Alcohol use: Not Currently  . Drug use: Not Currently     Colonoscopy:  PAP:  Bone density:  Lipid panel:  Allergies  Allergen Reactions  . Oxybutynin     Current Outpatient Medications  Medication Sig Dispense Refill  . omeprazole (PRILOSEC) 20 MG capsule Take 20 mg by mouth daily.    . predniSONE (DELTASONE) 20 MG tablet Take by mouth.    . psyllium (METAMUCIL) 58.6 % packet Take 1 packet by mouth daily.  No current facility-administered medications for this visit.    OBJECTIVE: Vitals:   06/18/20 1114  BP: (!) 135/97  Pulse: 96  Resp: 18  Temp: (!) 97.4 F (36.3 C)     Oconnor mass index is 22.41 kg/m.    ECOG FS:1 - Symptomatic but completely ambulatory  General: Thin, no acute distress. Eyes: Pink conjunctiva, anicteric sclera. HEENT: Normocephalic,  moist mucous membranes. Lungs: No audible wheezing or coughing. Heart: Regular rate and rhythm. Abdomen: Soft, nontender, no obvious distention. Musculoskeletal: No edema, cyanosis, or clubbing. Neuro: Alert, answering all questions appropriately. Cranial nerves grossly intact. Skin: No rashes or petechiae noted. Psych: Normal affect.   LAB RESULTS:  Lab Results  Component Value Date   NA 139 06/18/2020   K 3.6 06/18/2020   CL 105 06/18/2020   CO2 27 06/18/2020   GLUCOSE 103 (H) 06/18/2020   BUN 28 (H) 06/18/2020   CREATININE 1.26 (H) 06/18/2020   CALCIUM 8.4 (L) 06/18/2020   PROT 7.6 06/18/2020   ALBUMIN 3.1 (L) 06/18/2020   AST 32 06/18/2020   ALT 42 06/18/2020   ALKPHOS 48 06/18/2020   BILITOT 0.6 06/18/2020   GFRNONAA 51 (L) 06/18/2020   GFRAA 59 (L) 06/18/2020    Lab Results  Component Value Date   WBC 14.8 (H) 06/18/2020   NEUTROABS 9.0 (H) 06/18/2020   HGB 13.2 06/18/2020   HCT 39.6 06/18/2020   MCV 82.7 06/18/2020   PLT 432 (H) 06/18/2020     STUDIES: No results found.  ASSESSMENT: Left renal mass, weight loss.  PLAN:   1. Left renal mass: Patient's most recent CT scan on February 13, 2020 reviewed independently with essentially stable left renal mass measuring approximately 2.4 cm.  He continues to follow closely with urology. He has an MRI of the abdomen ordered, but this is yet to be scheduled. Continue simple observation. 2.  Weight loss: Improved. Previously, patient has had complete work-up including CT of the chest, abdomen, pelvis, colonoscopy, and EGD with no significant pathology. Appreciate dietary input. Continue follow-up with them as indicated. 3.  Constipation: Patient does not complain of this today.  Continue stool softeners daily and MiraLAX as needed. 4.  Urinary retention: Patient does not complain of this today. Continue follow-up with urology as scheduled. 5. Leukocytosis: Likely reactive, monitor. 6. Renal insufficiency: Mild,  monitor.  Patient expressed understanding and was in agreement with this plan. He also understands that He can call clinic at any time with any questions, concerns, or complaints.    Lloyd Huger, MD   06/20/2020 9:22 AM

## 2020-06-17 ENCOUNTER — Other Ambulatory Visit: Payer: Self-pay | Admitting: *Deleted

## 2020-06-17 DIAGNOSIS — N2889 Other specified disorders of kidney and ureter: Secondary | ICD-10-CM

## 2020-06-17 NOTE — Progress Notes (Signed)
cbc

## 2020-06-18 ENCOUNTER — Encounter: Payer: Self-pay | Admitting: Oncology

## 2020-06-18 ENCOUNTER — Other Ambulatory Visit: Payer: Self-pay

## 2020-06-18 ENCOUNTER — Inpatient Hospital Stay (HOSPITAL_BASED_OUTPATIENT_CLINIC_OR_DEPARTMENT_OTHER): Payer: Medicare Other | Admitting: Oncology

## 2020-06-18 ENCOUNTER — Inpatient Hospital Stay: Payer: Medicare Other | Attending: Oncology

## 2020-06-18 VITALS — BP 135/97 | HR 96 | Temp 97.4°F | Resp 18 | Wt 147.4 lb

## 2020-06-18 DIAGNOSIS — D72829 Elevated white blood cell count, unspecified: Secondary | ICD-10-CM | POA: Insufficient documentation

## 2020-06-18 DIAGNOSIS — N2889 Other specified disorders of kidney and ureter: Secondary | ICD-10-CM | POA: Insufficient documentation

## 2020-06-18 DIAGNOSIS — I10 Essential (primary) hypertension: Secondary | ICD-10-CM | POA: Diagnosis not present

## 2020-06-18 DIAGNOSIS — R338 Other retention of urine: Secondary | ICD-10-CM | POA: Diagnosis not present

## 2020-06-18 DIAGNOSIS — R5383 Other fatigue: Secondary | ICD-10-CM | POA: Insufficient documentation

## 2020-06-18 DIAGNOSIS — K5900 Constipation, unspecified: Secondary | ICD-10-CM | POA: Diagnosis not present

## 2020-06-18 DIAGNOSIS — R634 Abnormal weight loss: Secondary | ICD-10-CM | POA: Insufficient documentation

## 2020-06-18 DIAGNOSIS — R531 Weakness: Secondary | ICD-10-CM | POA: Insufficient documentation

## 2020-06-18 DIAGNOSIS — N401 Enlarged prostate with lower urinary tract symptoms: Secondary | ICD-10-CM | POA: Insufficient documentation

## 2020-06-18 LAB — CBC WITH DIFFERENTIAL/PLATELET
Abs Immature Granulocytes: 0.75 10*3/uL — ABNORMAL HIGH (ref 0.00–0.07)
Basophils Absolute: 0.1 10*3/uL (ref 0.0–0.1)
Basophils Relative: 1 %
Eosinophils Absolute: 0.1 10*3/uL (ref 0.0–0.5)
Eosinophils Relative: 1 %
HCT: 39.6 % (ref 39.0–52.0)
Hemoglobin: 13.2 g/dL (ref 13.0–17.0)
Immature Granulocytes: 5 %
Lymphocytes Relative: 28 %
Lymphs Abs: 4.2 10*3/uL — ABNORMAL HIGH (ref 0.7–4.0)
MCH: 27.6 pg (ref 26.0–34.0)
MCHC: 33.3 g/dL (ref 30.0–36.0)
MCV: 82.7 fL (ref 80.0–100.0)
Monocytes Absolute: 0.7 10*3/uL (ref 0.1–1.0)
Monocytes Relative: 5 %
Neutro Abs: 9 10*3/uL — ABNORMAL HIGH (ref 1.7–7.7)
Neutrophils Relative %: 60 %
Platelets: 432 10*3/uL — ABNORMAL HIGH (ref 150–400)
RBC: 4.79 MIL/uL (ref 4.22–5.81)
RDW: 16.6 % — ABNORMAL HIGH (ref 11.5–15.5)
WBC: 14.8 10*3/uL — ABNORMAL HIGH (ref 4.0–10.5)
nRBC: 0 % (ref 0.0–0.2)

## 2020-06-18 LAB — COMPREHENSIVE METABOLIC PANEL
ALT: 42 U/L (ref 0–44)
AST: 32 U/L (ref 15–41)
Albumin: 3.1 g/dL — ABNORMAL LOW (ref 3.5–5.0)
Alkaline Phosphatase: 48 U/L (ref 38–126)
Anion gap: 7 (ref 5–15)
BUN: 28 mg/dL — ABNORMAL HIGH (ref 8–23)
CO2: 27 mmol/L (ref 22–32)
Calcium: 8.4 mg/dL — ABNORMAL LOW (ref 8.9–10.3)
Chloride: 105 mmol/L (ref 98–111)
Creatinine, Ser: 1.26 mg/dL — ABNORMAL HIGH (ref 0.61–1.24)
GFR calc Af Amer: 59 mL/min — ABNORMAL LOW (ref >60)
GFR calc non Af Amer: 51 mL/min — ABNORMAL LOW (ref >60)
Glucose, Bld: 103 mg/dL — ABNORMAL HIGH (ref 70–99)
Potassium: 3.6 mmol/L (ref 3.5–5.1)
Sodium: 139 mmol/L (ref 135–145)
Total Bilirubin: 0.6 mg/dL (ref 0.3–1.2)
Total Protein: 7.6 g/dL (ref 6.5–8.1)

## 2020-06-18 NOTE — Progress Notes (Signed)
Pt in for follow up, reports very tired and weak.  States appetite has improved.

## 2020-06-19 LAB — THYROID PANEL WITH TSH
Free Thyroxine Index: 1.9 (ref 1.2–4.9)
T3 Uptake Ratio: 31 % (ref 24–39)
T4, Total: 6.1 ug/dL (ref 4.5–12.0)
TSH: 1.61 u[IU]/mL (ref 0.450–4.500)

## 2020-06-27 ENCOUNTER — Other Ambulatory Visit: Payer: Self-pay | Admitting: Neurology

## 2020-06-27 DIAGNOSIS — R413 Other amnesia: Secondary | ICD-10-CM

## 2020-07-23 ENCOUNTER — Ambulatory Visit: Payer: Medicare Other | Admitting: Physical Therapy

## 2020-07-23 DIAGNOSIS — F0280 Dementia in other diseases classified elsewhere without behavioral disturbance: Secondary | ICD-10-CM | POA: Insufficient documentation

## 2020-07-23 DIAGNOSIS — F0150 Vascular dementia without behavioral disturbance: Secondary | ICD-10-CM | POA: Insufficient documentation

## 2020-08-23 ENCOUNTER — Other Ambulatory Visit: Payer: Self-pay

## 2020-08-23 ENCOUNTER — Ambulatory Visit
Admission: RE | Admit: 2020-08-23 | Discharge: 2020-08-23 | Disposition: A | Discharge status: Home / Self Care | Payer: Medicare Other | Source: Ambulatory Visit | Attending: Neurology | Admitting: Neurology

## 2020-08-23 DIAGNOSIS — R413 Other amnesia: Secondary | ICD-10-CM

## 2020-08-23 MED ORDER — GADOBUTROL 1 MMOL/ML IV SOLN
6.0000 mL | Freq: Once | INTRAVENOUS | Status: AC | PRN
  Administered 2020-08-23: 6 mL via INTRAVENOUS

## 2020-08-23 MED ORDER — GADOBUTROL 1 MMOL/ML IV SOLN: 7.0000 mL | Freq: Once | INTRAVENOUS | Status: DC | PRN

## 2020-09-04 ENCOUNTER — Ambulatory Visit: Payer: Federal, State, Local not specified - PPO | Admitting: Urology

## 2020-09-09 NOTE — Progress Notes (Signed)
Cardiology Office Note  Date:  09/10/2020   ID:  [name], DOB June 01, 1930, MRN [id number]  PCP:  Dion Body, MD   Chief Complaint  Patient presents with  . office visit    6 month F/U; Meds verbally reviewed with patient.    HPI:  James Oconnor is a 86 y.o. 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    CTA chest negative for PE with minimal coronary artery calcification and mild aortic atherosclerosis.    Incidentally, left renal mass noted with follow-up MRI highly concerning for RCC.    seen cardiothoracic surgery at [facility] in regards to dilated aortic root arch   [facility] pulmonology 01/2020.    gastroenterology at [facility].  CT scan 02/13/2020.   Echo 02/08/2020 with LVEF 55%, mild AV stenosis and regurgitation, normal RV systolic function  Overall feels well, Reports family has been encouraging him to increase his fluid intake Per our records weight was 150 back in April now 166 pounds Presents today in wheelchair, leg weakness Mild SOB, ankle swelling Weight up 16 pounds,  "Having mucus", cough Weight at home 165.  Reports that he weighs daily  EKG personally reviewed by myself on todays visit Shows normal sinus rhythm rate 81 bpm no significant ST-T wave changes   PMH:   has a past medical history of BPH (benign prostatic hyperplasia), Hypertension, Overactive bladder, Prostate enlargement, and UTI (urinary tract infection).  PSH:    Past Surgical History:  Procedure Laterality Date  . COLONOSCOPY WITH PROPOFOL  N/A 11/30/2019   Procedure: COLONOSCOPY WITH PROPOFOL;  Surgeon: Toledo, Benay Pike, MD;  Location: [facility];  Service: Gastroenterology;  Laterality: N/A;  . ESOPHAGOGASTRODUODENOSCOPY (EGD) WITH PROPOFOL N/A 11/30/2019   Procedure: ESOPHAGOGASTRODUODENOSCOPY (EGD) WITH PROPOFOL;  Surgeon: Toledo, Benay Pike, MD;  Location: [facility];  Service: Gastroenterology;  Laterality: N/A;  . HOLEP-LASER ENUCLEATION OF THE PROSTATE WITH MORCELLATION N/A 10/13/2019   Procedure: HOLEP-LASER ENUCLEATION OF THE PROSTATE WITH MORCELLATION;  Surgeon: Billey Co, MD;  Location: [facility] ORS;  Service: Urology;  Laterality: N/A;  . NO PAST SURGERIES    . none      Current Outpatient Medications  Medication Sig Dispense Refill  . donepezil (ARICEPT) 5 MG tablet Take 5 mg by mouth at bedtime.    . Multiple Vitamins-Minerals (CENTRUM PO) Take by mouth daily.    Marland Kitchen omeprazole (PRILOSEC) 20 MG capsule Take 20 mg by mouth daily.    Marland Kitchen Phenylephrine-APAP-guaiFENesin (MUCINEX FAST-MAX PO) Take by mouth in the morning and at bedtime.    . predniSONE (DELTASONE) 20 MG tablet Take by mouth.    . psyllium (METAMUCIL) 58.6 % packet Take 1 packet by mouth daily.     No current facility-administered medications for this visit.     Allergies:   Oxybutynin   Social History:  The patient  reports that he quit smoking about 64 years ago. His smoking use included cigarettes. He quit after 60.00 years of use. He has never used smokeless tobacco.  He reports previous alcohol use. He reports previous drug use.   Family History:   family history includes Heart disease in his sister; Lung cancer in his brother and father; Throat cancer in his brother.    Review of Systems: Review of Systems  Constitutional: Negative.   HENT: Negative.   Respiratory: Positive for shortness of breath.   Cardiovascular: Positive for leg swelling.  Gastrointestinal: Negative.   Musculoskeletal: Negative.   Neurological: Negative.    Psychiatric/Behavioral: Negative.   All other systems reviewed and are negative.   PHYSICAL EXAM: VS:  BP 140/70 (BP Location: Left Arm, Patient Position: Sitting, Cuff Size: Normal)   Pulse 81   Ht 5' 7.5" (1.715 m)   Wt 166 lb (75.3 kg)   SpO2 97%   BMI 25.62 kg/m  , BMI Body mass index is 25.62 kg/m. GEN: Well nourished, well developed, in no acute distress HEENT: normal Neck: 8+ JVD, carotid bruits, or masses Cardiac: RRR; no murmurs, rubs, or gallops, trace pitting ankle edema  Respiratory:  clear to auscultation bilaterally, normal work of breathing Rales at the bases bilaterally GI: soft, nontender, nondistended, + BS MS: no deformity or atrophy Skin: warm and dry, no rash Neuro:  Strength and sensation are intact Psych: euthymic mood, full affect  Recent Labs: 06/18/2020: ALT 42; BUN 28; Creatinine, Ser 1.26; Hemoglobin 13.2; Platelets 432; Potassium 3.6; Sodium 139; TSH 1.610    Lipid Panel Lab Results  Component Value Date   CHOL 144 08/25/2019   HDL 55 08/25/2019   LDLCALC 74 08/25/2019   TRIG 75 08/25/2019      Wt Readings from Last 3 Encounters:  09/10/20 166 lb (75.3 kg)  06/18/20 147 lb 6.4 oz (66.9 kg)  03/15/20 148 lb 12.8 oz (67.5 kg)       ASSESSMENT AND PLAN:  Problem List Items Addressed This Visit      Cardiology Problems   Essential hypertension    Other Visit Diagnoses    Ascending aorta dilation (HCC)    -  Primary   Mild aortic stenosis       Demand ischemia (HCC)         Acute diastolic CHF Recommend he moderate his fluid intake, weight is up 16 pounds in 5 months Recommend he take Lasix 20 mg daily as needed for weight 163 pounds or higher Rales in the lungs, pitting ankle edema, He does report family has been pushing him to increase his water intake Recommend he back off on the fluids Continue daily weights  Dilated aorta Likely benign finding, will need periodic imaging  Diabetes type 2 We have encouraged continued  exercise, careful diet management in an effort to lose weight.     Total encounter time more than 25 minutes  Greater than 50% was spent in counseling and coordination of care with the patient    Signed, Esmond Plants, M.D., Ph.D. [place], [place]

## 2020-09-10 ENCOUNTER — Encounter: Payer: Self-pay | Admitting: Cardiovascular Disease

## 2020-09-10 ENCOUNTER — Ambulatory Visit (INDEPENDENT_AMBULATORY_CARE_PROVIDER_SITE_OTHER): Payer: Medicare Other | Admitting: Cardiovascular Disease

## 2020-09-10 ENCOUNTER — Other Ambulatory Visit: Payer: Self-pay

## 2020-09-10 VITALS — BP 140/70 | HR 81 | Ht 67.5 in | Wt 166.0 lb

## 2020-09-10 DIAGNOSIS — I350 Nonrheumatic aortic (valve) stenosis: Secondary | ICD-10-CM | POA: Diagnosis not present

## 2020-09-10 DIAGNOSIS — I10 Essential (primary) hypertension: Secondary | ICD-10-CM

## 2020-09-10 DIAGNOSIS — I248 Other forms of acute ischemic heart disease: Secondary | ICD-10-CM | POA: Diagnosis not present

## 2020-09-10 DIAGNOSIS — I77810 Thoracic aortic ectasia: Secondary | ICD-10-CM | POA: Diagnosis not present

## 2020-09-10 MED ORDER — FUROSEMIDE 20 MG PO TABS: 20.0000 mg | ORAL_TABLET | Freq: Every day | ORAL | 3 refills | Status: DC | PRN

## 2020-09-10 NOTE — Patient Instructions (Addendum)
Medication Instructions:  Please start furosemide 20 mg once a day when the weight is 163 or higher  If you need a refill on your cardiac medications before your next appointment, please call your pharmacy.    Lab work: No new labs needed   If you have labs (blood work) drawn today and your tests are completely normal, you will receive your results only by: Marland Kitchen MyChart Message (if you have MyChart) OR . A paper copy in the mail If you have any lab test that is abnormal or we need to change your treatment, we will call you to review the results.   Testing/Procedures: No new testing needed   Follow-Up: At [facility], you and your health needs are our priority.  As part of our continuing mission to provide you with exceptional heart care, we have created designated Provider Care Teams.  These Care Teams include your primary Cardiologist (physician) and Advanced Practice Providers (APPs -  Physician Assistants and Nurse Practitioners) who all work together to provide you with the care you need, when you need it.  . You will need a follow up appointment in 2 months  . Providers on your designated Care Team:   . Murray Hodgkins, NP . Christell Faith, PA-C . Marrianne Mood, PA-C  Any Other Special Instructions Will Be Listed Below (If Applicable).  COVID-19 Vaccine Information can be found at: [email or web address].uk For questions related to vaccine distribution or appointments, please email [email or web address] or call [phone].

## 2020-09-11 NOTE — Progress Notes (Signed)
09/12/2020 5:01 PM   James Oconnor 1929/12/31 [id number]  Referring provider: Dion Body, MD [facility] [place],  [place] [address]  Chief Complaint  Patient presents with   Urinary Retention    HPI: James Oconnor is an 86 year old male with BPH with retention, history of elevated PSA and RCC who presents today for follow up.   BPH with retention I PSS score: 3/2   PVR 0 mL  Main complaint: None.  Patient denies any modifying or aggravating factors.  Patient denies any gross hematuria, dysuria or suprapubic/flank pain.  Patient denies any fevers, chills, nausea or vomiting.   Underwent an uncomplicated HoLEP with removal of 100 g tissue on 10/13/2019 with Dr. Diamantina Oconnor.     IPSS    Row Name 09/12/20 1000         International Prostate Symptom Score   How often have you had the sensation of not emptying your bladder? Not at All     How often have you had to urinate less than every two hours? Not at All     How often have you found you stopped and started again several times when you urinated? Not at All     How often have you found it difficult to postpone urination? Not at All     How often have you had a weak urinary stream? Not at All     How often have you had to strain to start urination? Not at All     How many times did you typically get up at night to urinate? 3 Times     Total IPSS Score 3       Quality of Life due to urinary symptoms   If you were to spend the rest of your life with your urinary condition just the way it is now how would you feel about that? Mostly Satisfied            Score:  1-7 Mild 8-19 Moderate 20-35 Severe  Elevated PSA PSA 7.2 two years age. Pathology from prostate chips obtained during the HoLEP in November 2020 were negative for malignancy  Left RCC Contrast CT 02/13/2020 2.4 cm solid mass of the superior pole of the left kidney, consistent with renal cell carcinoma.   PMH: Past Medical  History:  Diagnosis Date   BPH (benign prostatic hyperplasia)    Hypertension    Overactive bladder    Prostate enlargement    UTI (urinary tract infection)     Surgical History: Past Surgical History:  Procedure Laterality Date   COLONOSCOPY WITH PROPOFOL N/A 11/30/2019   Procedure: COLONOSCOPY WITH PROPOFOL;  Surgeon: Toledo, Benay Pike, MD;  Location: [facility] ENDOSCOPY;  Service: Gastroenterology;  Laterality: N/A;   ESOPHAGOGASTRODUODENOSCOPY (EGD) WITH PROPOFOL N/A 11/30/2019   Procedure: ESOPHAGOGASTRODUODENOSCOPY (EGD) WITH PROPOFOL;  Surgeon: Toledo, Benay Pike, MD;  Location: [facility] ENDOSCOPY;  Service: Gastroenterology;  Laterality: N/A;   HOLEP-LASER ENUCLEATION OF THE PROSTATE WITH MORCELLATION N/A 10/13/2019   Procedure: HOLEP-LASER ENUCLEATION OF THE PROSTATE WITH MORCELLATION;  Surgeon: Billey Co, MD;  Location: [facility] ORS;  Service: Urology;  Laterality: N/A;   NO PAST SURGERIES     none      Home Medications:  Allergies as of 09/12/2020      Reactions   Oxybutynin       Medication List       Accurate as of September 12, 2020 11:59 PM. If you have any questions,  ask your nurse or doctor.        CENTRUM PO Take by mouth daily.   donepezil 5 MG tablet Commonly known as: ARICEPT Take 5 mg by mouth at bedtime.   furosemide 20 MG tablet Commonly known as: LASIX Take 1 tablet (20 mg total) by mouth daily as needed for edema (for weight >163 pounds).   MUCINEX FAST-MAX PO Take by mouth in the morning and at bedtime.   omeprazole 20 MG capsule Commonly known as: PRILOSEC Take 20 mg by mouth daily.   predniSONE 20 MG tablet Commonly known as: DELTASONE Take by mouth.   psyllium 58.6 % packet Commonly known as: METAMUCIL Take 1 packet by mouth daily.   Trelegy Ellipta 100-62.5-25 MCG/INH Aepb Generic drug: Fluticasone-Umeclidin-Vilant INHALE 1 PUFF INTO THE LUNGS ONCE DAILY       Allergies:  Allergies  Allergen Reactions   Oxybutynin      Family History: Family History  Problem Relation Age of Onset   Lung cancer Father    Heart disease Sister    Lung cancer Brother    Throat cancer Brother     Social History:  reports that he quit smoking about 64 years ago. His smoking use included cigarettes. He quit after 60.00 years of use. He has never used smokeless tobacco. He reports previous alcohol use. He reports previous drug use.  ROS: For pertinent review of systems please refer to history of present illness  Physical Exam: BP (!) 173/96    Pulse 81    Ht 5\' 8"  (1.727 m)    Wt 162 lb (73.5 kg)    BMI 24.63 kg/m   Constitutional:  Well nourished. Alert and oriented, No acute distress. HEENT: [place] AT, mask in place.  Trachea midline Cardiovascular: No clubbing, cyanosis, or edema. Respiratory: Normal respiratory effort, no increased work of breathing. Neurologic: Grossly intact, no focal deficits, moving all 4 extremities. Psychiatric: Normal mood and affect.  Laboratory Data: Lab Results  Component Value Date   WBC 14.8 (H) 06/18/2020   HGB 13.2 06/18/2020   HCT 39.6 06/18/2020   MCV 82.7 06/18/2020   PLT 432 (H) 06/18/2020    Lab Results  Component Value Date   CREATININE 1.26 (H) 06/18/2020    Lab Results  Component Value Date   TSH 1.610 06/18/2020       Component Value Date/Time   CHOL 144 08/25/2019 0641   HDL 55 08/25/2019 0641   CHOLHDL 2.6 08/25/2019 0641   VLDL 15 08/25/2019 0641   LDLCALC 74 08/25/2019 0641    Lab Results  Component Value Date   AST 32 06/18/2020   Lab Results  Component Value Date   ALT 42 06/18/2020    Urinalysis    Component Value Date/Time   COLORURINE YELLOW (A) 09/11/2019 1803   APPEARANCEUR Cloudy (A) 10/03/2019 1104   LABSPEC 1.004 (L) 09/11/2019 1803   PHURINE 6.0 09/11/2019 1803   GLUCOSEU Negative 10/03/2019 1104   HGBUR SMALL (A) 09/11/2019 1803   BILIRUBINUR Negative 10/03/2019 1104   KETONESUR NEGATIVE 09/11/2019 1803   PROTEINUR 2+  (A) 10/03/2019 1104   PROTEINUR NEGATIVE 09/11/2019 1803   NITRITE Positive (A) 10/03/2019 1104   NITRITE NEGATIVE 09/11/2019 1803   LEUKOCYTESUR 3+ (A) 10/03/2019 1104   LEUKOCYTESUR NEGATIVE 09/11/2019 1803    I have reviewed the labs.  Pertinent Imaging Results for James Oconnor, James Oconnor (MRN [id number]) as of 09/12/2020 10:34  Ref. Range 09/12/2020 10:06  Scan Result Unknown 0 ml  Assessment & Plan:    1. Urinary retention  S/P HoLEP on 10/13/2019 PVR's have been minimal   2. BPH with obstruction/lower urinary tract symptoms IPSS score is 3/2 - he is very pleased with the HoLEP results  Continue conservative management, avoiding bladder irritants and timed voiding's RTC in 12 months for IPSS, PSA, PVR and exam   3. History of elevated PSA Pathology from prostate chips was negative for cancer  4. RCC 3 cm Left renal mass concerning for renal cell carcinoma pursing active surveillance  Schedule MRI of kidneys at this time, will call patient with results   Return in about 6 months (around 03/13/2021) for MRI of the kidneys.  These notes generated with voice recognition software. I apologize for typographical errors.  Zara Council, PA-C  [facility] [address]  [place], [place] [address] [phone]

## 2020-09-12 ENCOUNTER — Encounter: Payer: Self-pay | Admitting: Urology

## 2020-09-12 ENCOUNTER — Ambulatory Visit (INDEPENDENT_AMBULATORY_CARE_PROVIDER_SITE_OTHER): Payer: Medicare Other | Admitting: Urology

## 2020-09-12 ENCOUNTER — Other Ambulatory Visit: Payer: Self-pay

## 2020-09-12 ENCOUNTER — Telehealth: Payer: Self-pay | Admitting: Urology

## 2020-09-12 VITALS — BP 173/96 | HR 81 | Ht 68.0 in | Wt 162.0 lb

## 2020-09-12 DIAGNOSIS — N401 Enlarged prostate with lower urinary tract symptoms: Secondary | ICD-10-CM

## 2020-09-12 DIAGNOSIS — R972 Elevated prostate specific antigen [PSA]: Secondary | ICD-10-CM

## 2020-09-12 DIAGNOSIS — N2889 Other specified disorders of kidney and ureter: Secondary | ICD-10-CM

## 2020-09-12 DIAGNOSIS — N138 Other obstructive and reflux uropathy: Secondary | ICD-10-CM

## 2020-09-12 DIAGNOSIS — C642 Malignant neoplasm of left kidney, except renal pelvis: Secondary | ICD-10-CM | POA: Diagnosis not present

## 2020-09-12 DIAGNOSIS — R339 Retention of urine, unspecified: Secondary | ICD-10-CM

## 2020-09-12 LAB — BLADDER SCAN AMB NON-IMAGING: Scan Result: 0

## 2020-09-12 NOTE — Telephone Encounter (Signed)
It is time for Mr. James Oconnor to have another MRI of his kidneys.  Is the order that is in the chart still good or do I need to place a new order?

## 2020-09-12 NOTE — Telephone Encounter (Signed)
I am checking and will let you know

## 2020-10-02 ENCOUNTER — Other Ambulatory Visit: Payer: Self-pay

## 2020-10-02 ENCOUNTER — Ambulatory Visit
Admission: RE | Admit: 2020-10-02 | Discharge: 2020-10-02 | Disposition: A | Discharge status: Home / Self Care | Payer: Medicare Other | Source: Ambulatory Visit | Attending: Urology | Admitting: Urology

## 2020-10-02 ENCOUNTER — Other Ambulatory Visit: Payer: Self-pay | Admitting: Urology

## 2020-10-02 DIAGNOSIS — N2889 Other specified disorders of kidney and ureter: Secondary | ICD-10-CM | POA: Diagnosis present

## 2020-10-02 MED ORDER — GADOBUTROL 1 MMOL/ML IV SOLN
7.0000 mL | Freq: Once | INTRAVENOUS | Status: AC | PRN
  Administered 2020-10-02: 7 mL via INTRAVENOUS

## 2020-10-03 ENCOUNTER — Telehealth: Payer: Self-pay | Admitting: *Deleted

## 2020-10-03 NOTE — Telephone Encounter (Signed)
Patient returned call-informed imaging results. Voiced understanding.

## 2020-11-11 NOTE — Progress Notes (Signed)
Cardiology Office Note  Date:  11/12/2020   ID:  James Oconnor, DOB 1930-06-19, MRN [id number]  PCP:  Dion Body, MD   Chief Complaint  Patient presents with  . Follow-up    2 Months follow up. Medications verbally reviewed with patient.     HPI:  James Oconnor is a 86 y.o. 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,  probable RCC,  bladder outlet obstruction,  Dm2,  HTN,  Dysphagia, ILD,  anemia,  renal stones  CT scan minimal coronary calcification, mild aortic atherosclerosis presents for f/u of his follow-up of aortic arch dilation and HTN  Diagnosed with suspected renal cell carcinoma on the left No left flank pain Has declined work-up  MRI results reviewed with him Stable solid renal neoplasm, likely renal cell carcinoma, arising from the upper pole the LEFT kidney 2.4 x 2.3 cm greatest axial dimension  Doing well otherwise, no significant complaints Some neck pain when turning neck   Weight stable 163 to 166 Takes lasix sparingly, has only take one  No recent episodes of bronchitis Admitted late 08/2019 bronchitis  Cardiac work-up at that time reviewed with him   Echo with normal LV SF with EF 60-65%, mild LVH, DD, normal RVSF and cavity size, mild MR.    CTA chest negative for PE with minimal coronary artery calcification and mild aortic atherosclerosis.   seen cardiothoracic surgery at [facility] in regards to dilated aortic root arch  CT scan 02/13/2020.   Echo 02/08/2020 with LVEF 55%, mild AV stenosis and regurgitation, normal RV systolic function    PMH:   has a past medical history of BPH (benign prostatic hyperplasia), Hypertension, Overactive bladder, Prostate enlargement, and UTI (urinary tract infection).  PSH:    Past Surgical History:  Procedure Laterality Date  . COLONOSCOPY WITH PROPOFOL N/A 11/30/2019   Procedure: COLONOSCOPY WITH PROPOFOL;  Surgeon: Toledo, Benay Pike, MD;  Location: [facility] END[facility]COPY;  Service:  Gastroenterology;  Laterality: N/A;  . ESOPHAGOGASTRODUODENOSCOPY (EGD) WITH PROPOFOL N/A 11/30/2019   Procedure: ESOPHAGOGASTRODUODENOSCOPY (EGD) WITH PROPOFOL;  Surgeon: Toledo, Benay Pike, MD;  Location: [facility] END[facility];  Service: Gastroenterology;  Laterality: N/A;  . HOLEP-LASER ENUCLEATION OF THE PROSTATE WITH MORCELLATION N/A 10/13/2019   Procedure: HOLEP-LASER ENUCLEATION OF THE PROSTATE WITH MORCELLATION;  Surgeon: Billey Co, MD;  Location: [facility] ORS;  Service: Urology;  Laterality: N/A;  . NO PAST SURGERIES    . none      Current Outpatient Medications  Medication Sig Dispense Refill  . donepezil (ARICEPT) 5 MG tablet Take 5 mg by mouth at bedtime.    . furosemide (LASIX) 20 MG tablet Take 1 tablet (20 mg total) by mouth daily as needed for edema (for weight >163 pounds). 30 tablet 3   No current facility-administered medications for this visit.     Allergies:   Oxybutynin   Social History:  The patient  reports that he quit smoking about 64 years ago. His smoking use included cigarettes. He quit after 60.00 years of use. He has never used smokeless tobacco. He reports previous alcohol use. He reports previous drug use.   Family History:   family history includes Heart disease in his sister; Lung cancer in his brother and father; Throat cancer in his brother.    Review of Systems: Review of Systems  Constitutional: Negative.   HENT: Negative.   Respiratory: Negative.   Cardiovascular: Negative.   Gastrointestinal: Negative.   Musculoskeletal: Positive for neck pain.  Neurological: Negative.   Psychiatric/Behavioral: Negative.   All other systems reviewed and are negative.   PHYSICAL EXAM: VS:  BP (!) 142/82 (BP Location: Left Arm, Patient Position: Sitting, Cuff Size: Normal)   Pulse 80   Ht 5\' 8"  (1.727 m)   Wt 163 lb (73.9 kg)   SpO2 91%   BMI 24.78 kg/m  , BMI Body mass index is 24.78 kg/m. Constitutional:  oriented to person, place, and time. No  distress.  HENT:  Head: Grossly normal Eyes:  no discharge. No scleral icterus.  Neck: No JVD, no carotid bruits  Cardiovascular: Regular rate and rhythm, no murmurs appreciated Pulmonary/Chest: Clear to auscultation bilaterally, no wheezes or rails Abdominal: Soft.  no distension.  no tenderness.  Musculoskeletal: Normal range of motion Neurological:  normal muscle tone. Coordination normal. No atrophy Skin: Skin warm and dry Psychiatric: normal affect, pleasant   Recent Labs: 06/18/2020: ALT 42; BUN 28; Creatinine, Ser 1.26; Hemoglobin 13.2; Platelets 432; Potassium 3.6; Sodium 139; TSH 1.610    Lipid Panel Lab Results  Component Value Date   CHOL 144 08/25/2019   HDL 55 08/25/2019   LDLCALC 74 08/25/2019   TRIG 75 08/25/2019      Wt Readings from Last 3 Encounters:  11/12/20 163 lb (73.9 kg)  09/12/20 162 lb (73.5 kg)  09/10/20 166 lb (75.3 kg)       ASSESSMENT AND PLAN:  Problem List Items Addressed This Visit      Cardiology Problems   Essential hypertension    Other Visit Diagnoses    Ascending aorta dilation ([place])    -  Primary   Mild aortic stenosis       Demand ischemia ([place])         Acute diastolic CHF Stable,  Euvolemic Not on laisx, (sparingly) Moderates fluid intake Denies excessive salt intake  Dilated aorta will need periodic imaging Consider repeat CT scan 1-2 years  Diabetes type 2 Weight stable Managed by PMD    Total encounter time more than 25 minutes  Greater than 50% was spent in counseling and coordination of care with the patient    Signed, Esmond Plants, M.D., Ph.D. [place], [place]

## 2020-11-12 ENCOUNTER — Encounter: Payer: Self-pay | Admitting: Cardiovascular Disease

## 2020-11-12 ENCOUNTER — Ambulatory Visit (INDEPENDENT_AMBULATORY_CARE_PROVIDER_SITE_OTHER): Payer: Medicare Other | Admitting: Cardiovascular Disease

## 2020-11-12 ENCOUNTER — Other Ambulatory Visit: Payer: Self-pay

## 2020-11-12 VITALS — BP 142/82 | HR 80 | Ht 68.0 in | Wt 163.0 lb

## 2020-11-12 DIAGNOSIS — I77810 Thoracic aortic ectasia: Secondary | ICD-10-CM

## 2020-11-12 DIAGNOSIS — I350 Nonrheumatic aortic (valve) stenosis: Secondary | ICD-10-CM

## 2020-11-12 DIAGNOSIS — I10 Essential (primary) hypertension: Secondary | ICD-10-CM | POA: Diagnosis not present

## 2020-11-12 DIAGNOSIS — I248 Other forms of acute ischemic heart disease: Secondary | ICD-10-CM | POA: Diagnosis not present

## 2020-11-12 NOTE — Patient Instructions (Signed)
Medication Instructions:  No changes  If you need a refill on your cardiac medications before your next appointment, please call your pharmacy.    Lab work: No new labs needed   If you have labs (blood work) drawn today and your tests are completely normal, you will receive your results only by: . MyChart Message (if you have MyChart) OR . A paper copy in the mail If you have any lab test that is abnormal or we need to change your treatment, we will call you to review the results.   Testing/Procedures: No new testing needed   Follow-Up: At [facility], you and your health needs are our priority.  As part of our continuing mission to provide you with exceptional heart care, we have created designated Provider Care Teams.  These Care Teams include your primary Cardiologist (physician) and Advanced Practice Providers (APPs -  Physician Assistants and Nurse Practitioners) who all work together to provide you with the care you need, when you need it.  . You will need a follow up appointment in 12 months  . Providers on your designated Care Team:   . Christopher Berge, NP . Ryan Dunn, PA-C . Jacquelyn Visser, PA-C  Any Other Special Instructions Will Be Listed Below (If Applicable).  COVID-19 Vaccine Information can be found at: [email or web address] For questions related to vaccine distribution or appointments, please email [email or web address] or call [phone].     

## 2020-11-25 DIAGNOSIS — I77810 Thoracic aortic ectasia: Secondary | ICD-10-CM | POA: Insufficient documentation

## 2020-12-05 ENCOUNTER — Other Ambulatory Visit: Payer: Self-pay | Admitting: Cardiovascular Disease

## 2020-12-27 ENCOUNTER — Inpatient Hospital Stay: Payer: Medicare Other | Attending: Oncology

## 2020-12-27 ENCOUNTER — Encounter: Payer: Self-pay | Admitting: Oncology

## 2020-12-27 ENCOUNTER — Inpatient Hospital Stay (HOSPITAL_BASED_OUTPATIENT_CLINIC_OR_DEPARTMENT_OTHER): Payer: Medicare Other | Admitting: Oncology

## 2020-12-27 VITALS — BP 153/86 | HR 87 | Temp 97.7°F | Resp 20 | Wt 162.3 lb

## 2020-12-27 DIAGNOSIS — N2889 Other specified disorders of kidney and ureter: Secondary | ICD-10-CM

## 2020-12-27 DIAGNOSIS — I5031 Acute diastolic (congestive) heart failure: Secondary | ICD-10-CM | POA: Insufficient documentation

## 2020-12-27 DIAGNOSIS — R338 Other retention of urine: Secondary | ICD-10-CM | POA: Diagnosis not present

## 2020-12-27 DIAGNOSIS — I110 Hypertensive heart disease with heart failure: Secondary | ICD-10-CM | POA: Diagnosis not present

## 2020-12-27 DIAGNOSIS — E119 Type 2 diabetes mellitus without complications: Secondary | ICD-10-CM | POA: Insufficient documentation

## 2020-12-27 DIAGNOSIS — R634 Abnormal weight loss: Secondary | ICD-10-CM | POA: Diagnosis present

## 2020-12-27 LAB — CBC WITH DIFFERENTIAL/PLATELET
Abs Immature Granulocytes: 0.02 10*3/uL (ref 0.00–0.07)
Basophils Absolute: 0 10*3/uL (ref 0.0–0.1)
Basophils Relative: 1 %
Eosinophils Absolute: 0.3 10*3/uL (ref 0.0–0.5)
Eosinophils Relative: 5 %
HCT: 37.1 % — ABNORMAL LOW (ref 39.0–52.0)
Hemoglobin: 12.2 g/dL — ABNORMAL LOW (ref 13.0–17.0)
Immature Granulocytes: 0 %
Lymphocytes Relative: 43 %
Lymphs Abs: 2.7 10*3/uL (ref 0.7–4.0)
MCH: 26.3 pg (ref 26.0–34.0)
MCHC: 32.9 g/dL (ref 30.0–36.0)
MCV: 80 fL (ref 80.0–100.0)
Monocytes Absolute: 0.5 10*3/uL (ref 0.1–1.0)
Monocytes Relative: 8 %
Neutro Abs: 2.7 10*3/uL (ref 1.7–7.7)
Neutrophils Relative %: 43 %
Platelets: 326 10*3/uL (ref 150–400)
RBC: 4.64 MIL/uL (ref 4.22–5.81)
RDW: 14.2 % (ref 11.5–15.5)
WBC: 6.3 10*3/uL (ref 4.0–10.5)
nRBC: 0 % (ref 0.0–0.2)

## 2020-12-27 LAB — COMPREHENSIVE METABOLIC PANEL
ALT: 15 U/L (ref 0–44)
AST: 24 U/L (ref 15–41)
Albumin: 3.6 g/dL (ref 3.5–5.0)
Alkaline Phosphatase: 61 U/L (ref 38–126)
Anion gap: 9 (ref 5–15)
BUN: 14 mg/dL (ref 8–23)
CO2: 27 mmol/L (ref 22–32)
Calcium: 8.5 mg/dL — ABNORMAL LOW (ref 8.9–10.3)
Chloride: 100 mmol/L (ref 98–111)
Creatinine, Ser: 0.96 mg/dL (ref 0.61–1.24)
GFR, Estimated: >60 mL/min (ref >60)
Glucose, Bld: 88 mg/dL (ref 70–99)
Potassium: 3.9 mmol/L (ref 3.5–5.1)
Sodium: 136 mmol/L (ref 135–145)
Total Bilirubin: 0.7 mg/dL (ref 0.3–1.2)
Total Protein: 8.6 g/dL — ABNORMAL HIGH (ref 6.5–8.1)

## 2020-12-27 NOTE — Progress Notes (Signed)
Patient denies any concerns today.  

## 2020-12-27 NOTE — Progress Notes (Signed)
[facility]  [phone] [phone]  ID: James Oconnor OB: Nov 27, 1930  MR#: [id number]  YUF#:[id number]  Patient Care Team: Marisue Ivan, MD as PCP - General (Family Medicine) Antonieta Iba, MD as PCP - Cardiology (Cardiology)  CHIEF COMPLAINT: Left renal mass, weight loss.  INTERVAL HISTORY: Patient returns to clinic today for repeat laboratory work and further evaluation.  He was last seen in clinic on 06/18/2020.  In the interim he was seen by Dr. Mariah Milling for follow-up of acute diastolic CHF, dilated aorta and type 2 diabetes.  He was encouraged to decrease on oral intake due to weight gain and to take Lasix.  He met with urology on 09/12/2020 for urinary retention.  S/p HoLEP procedure on 10/13/2019.  PVR's have been minimal.  He was scheduled for an MRI of his kidneys secondary to RCC.  This is scheduled for April 2022.  Today, he is doing well.  He continues to have chronic weakness and fatigue but his appetite has improved and weight is stable. He has lasix to take for weight gain secondary to chf. He has no neurologic complaints.  He denies any recent fevers or illnesses.  He has no chest pain, shortness of breath, cough, or hemoptysis.  He denies any nausea, vomiting, constipation, or diarrhea.  He has no urinary complaints. Patient offers no further specific complaints today.  REVIEW OF SYSTEMS:   Review of Systems  Constitutional: Positive for malaise/fatigue. Negative for fever and weight loss.  Respiratory: Negative for cough and shortness of breath.   Cardiovascular: Negative.  Negative for chest pain and leg swelling.  Gastrointestinal: Negative.  Negative for abdominal pain and constipation.  Genitourinary: Negative.  Negative for frequency and urgency.  Musculoskeletal: Negative.  Negative for back pain.  Skin: Negative.  Negative for rash.  Neurological: Positive for weakness. Negative for dizziness, focal weakness and  headaches.  Psychiatric/Behavioral: Negative.  The patient is not nervous/anxious.     As per HPI. Otherwise, a complete review of systems is negative.  PAST MEDICAL HISTORY: Past Medical History:  Diagnosis Date  . BPH (benign prostatic hyperplasia)   . Hypertension   . Overactive bladder   . Prostate enlargement   . UTI (urinary tract infection)     PAST SURGICAL HISTORY: Past Surgical History:  Procedure Laterality Date  . COLONOSCOPY WITH PROPOFOL N/A 11/30/2019   Procedure: COLONOSCOPY WITH PROPOFOL;  Surgeon: Toledo, Boykin Nearing, MD;  Location: [facility] END[facility];  Service: Gastroenterology;  Laterality: N/A;  . ESOPHAGOGASTRODUODENOSCOPY (EGD) WITH PROPOFOL N/A 11/30/2019   Procedure: ESOPHAGOGASTRODUODENOSCOPY (EGD) WITH PROPOFOL;  Surgeon: Toledo, Boykin Nearing, MD;  Location: [facility];  Service: Gastroenterology;  Laterality: N/A;  . HOLEP-LASER ENUCLEATION OF THE PROSTATE WITH MORCELLATION N/A 10/13/2019   Procedure: HOLEP-LASER ENUCLEATION OF THE PROSTATE WITH MORCELLATION;  Surgeon: Sondra Come, MD;  Location: [facility] ORS;  Service: Urology;  Laterality: N/A;  . NO PAST SURGERIES    . none      FAMILY HISTORY: Family History  Problem Relation Age of Onset  . Lung cancer Father   . Heart disease Sister   . Lung cancer Brother   . Throat cancer Brother     ADVANCED DIRECTIVES (Y/N):  N  HEALTH MAINTENANCE: Social History   Tobacco Use  . Smoking status: Former Smoker    Years: 60.00    Types: Cigarettes    Quit date: 1957    Years since quitting: 65.1  . Smokeless tobacco: Never Used  . Tobacco  comment: quit smoking 76yrs ago  Vaping Use  . Vaping Use: Never used  Substance Use Topics  . Alcohol use: Not Currently  . Drug use: Not Currently     Colonoscopy:  PAP:  Bone density:  Lipid panel:  Allergies  Allergen Reactions  . Oxybutynin     Current Outpatient Medications  Medication Sig Dispense Refill  . donepezil (ARICEPT) 5 MG tablet  Take 5 mg by mouth at bedtime.     No current facility-administered medications for this visit.    OBJECTIVE: There were no vitals filed for this visit.   There is no height or weight on file to calculate BMI.    ECOG FS:1 - Symptomatic but completely ambulatory  Physical Exam Constitutional:      General: Vital signs are normal.     Appearance: Normal appearance.  HENT:     Head: Normocephalic and atraumatic.  Eyes:     Pupils: Pupils are equal, round, and reactive to light.  Cardiovascular:     Rate and Rhythm: Normal rate and regular rhythm.     Heart sounds: Normal heart sounds. No murmur heard.   Pulmonary:     Effort: Pulmonary effort is normal.     Breath sounds: Normal breath sounds. No wheezing.  Abdominal:     General: Bowel sounds are normal. There is no distension.     Palpations: Abdomen is soft.     Tenderness: There is no abdominal tenderness.  Musculoskeletal:        General: No edema. Normal range of motion.     Cervical back: Normal range of motion.  Skin:    General: Skin is warm and dry.     Findings: No rash.  Neurological:     Mental Status: He is alert and oriented to person, place, and time.  Psychiatric:        Judgment: Judgment normal.    LAB RESULTS:  Lab Results  Component Value Date   NA 139 06/18/2020   K 3.6 06/18/2020   CL 105 06/18/2020   CO2 27 06/18/2020   GLUCOSE 103 (H) 06/18/2020   BUN 28 (H) 06/18/2020   CREATININE 1.26 (H) 06/18/2020   CALCIUM 8.4 (L) 06/18/2020   PROT 7.6 06/18/2020   ALBUMIN 3.1 (L) 06/18/2020   AST 32 06/18/2020   ALT 42 06/18/2020   ALKPHOS 48 06/18/2020   BILITOT 0.6 06/18/2020   GFRNONAA 51 (L) 06/18/2020   GFRAA 59 (L) 06/18/2020    Lab Results  Component Value Date   WBC 6.3 12/27/2020   NEUTROABS 2.7 12/27/2020   HGB 12.2 (L) 12/27/2020   HCT 37.1 (L) 12/27/2020   MCV 80.0 12/27/2020   PLT 326 12/27/2020     STUDIES: No results found.  ASSESSMENT: Left renal mass, weight  loss.  PLAN:   1. Left renal mass:  -CT scan on 10/02/2020 shows stable solid renal neoplasm likely renal cell carcinoma on upper left kidney.  No new lesions. -He is followed closely with urology and has MRI scheduled for 03/21/2021. -Continue simple observation at this time.  2.  Weight loss:  -Improved.  -Previous work-u included CT of CAP, colonoscopy and EGD which were all negative for malignancy. -Weight has been stable around 162 pounds. -He is followed closely by cardiology for CHF.  -He uses Lasix sparingly for weight gain. Ideal weight is 163lbs.  3.  Urinary retention: -Recently seen by urology. - s/p HoLEP on 10/13/19. -PVRs have been minimal. -He was  encouraged to avoid bladder irritants and timed voiding's.  Disposition: -RTC in 6 months for follow-up, lab work and review of MRI.  Greater than 50% was spent in counseling and coordination of care with this patient including but not limited to discussion of the relevant topics above (See A&P) including, but not limited to diagnosis and management of acute and chronic medical conditions.   Patient expressed understanding and was in agreement with this plan. He also understands that He can call clinic at any time with any questions, concerns, or complaints.   Jacquelin Hawking, NP   12/27/2020 10:28 AM

## 2021-03-07 ENCOUNTER — Other Ambulatory Visit: Payer: Self-pay | Admitting: Gastroenterology

## 2021-03-07 DIAGNOSIS — R1312 Dysphagia, oropharyngeal phase: Secondary | ICD-10-CM

## 2021-03-07 DIAGNOSIS — K224 Dyskinesia of esophagus: Secondary | ICD-10-CM

## 2021-03-28 ENCOUNTER — Other Ambulatory Visit: Payer: Self-pay

## 2021-03-28 ENCOUNTER — Ambulatory Visit
Admission: RE | Admit: 2021-03-28 | Discharge: 2021-03-28 | Disposition: A | Discharge status: Home / Self Care | Payer: Medicare Other | Source: Ambulatory Visit | Attending: Gastroenterology | Admitting: Gastroenterology

## 2021-03-28 DIAGNOSIS — R1312 Dysphagia, oropharyngeal phase: Secondary | ICD-10-CM | POA: Diagnosis present

## 2021-03-28 DIAGNOSIS — K224 Dyskinesia of esophagus: Secondary | ICD-10-CM

## 2021-03-29 DIAGNOSIS — K224 Dyskinesia of esophagus: Secondary | ICD-10-CM | POA: Diagnosis not present

## 2021-03-29 NOTE — Progress Notes (Signed)
Modified Barium Swallow Progress Note  Patient Details  Name: James Oconnor MRN: [id number] Date of Birth: 02-10-1930  Today's Date: 03/29/2021  Modified Barium Swallow completed.  Full report located under Chart Review in the Imaging Section.  Brief recommendations include the following:  Clinical Impression  Pt's oropharyngeal abilities continue to be very similar to those observed during previous MBSS on 09/01/2019. Orally, pt continues producing multiple swallows per bolus of thin liquids, puree, mechanical soft and whole barium with thin liquids. Pt's swallow initiation appears slightly more delayed than previous study. Currently swallow is initiated at the level of the vallecula and as bolus falls into pyriform sinuses (> 50% of opportunities). He also appears to have slightly more pharyngeal residue, specifically in the vallecula. Pt had one instance of Increased vallecular residue when taking a larger sip of thin liquid that resulted in aspiration of vallecular residue as second swallow was initiated.  Outside of this one instance, pt achieved good airway protection throughout study. An esophageal sweep was performed when consuming whole barium tablet with possible stasis observed in the thoracic esophagus. Education provided to pt on results of this study with aspiration precautions reviewed specifically small sips. Recommend further esophageal assessment.   Swallow Evaluation Recommendations   Recommended Consults: Consider esophageal assessment;Consider GI evaluation   SLP Diet Recommendations: Regular solids;Thin liquid   Liquid Administration via: Cup   Medication Administration: Whole meds with liquid   Supervision: Patient able to self feed   Compensations: Minimize environmental distractions;Slow rate;Small sips/bites   Postural Changes: Seated upright at 90 degrees   Oral Care Recommendations: Oral care BID       James Oconnor B. James Oconnor M.S., CCC-SLP, [place]  Pathologist Rehabilitation Services Office [place] 03/29/2021,1:21 PM

## 2021-04-15 ENCOUNTER — Ambulatory Visit
Admission: RE | Admit: 2021-04-15 | Discharge: 2021-04-15 | Disposition: A | Discharge status: Home / Self Care | Payer: Medicare Other | Source: Ambulatory Visit | Attending: Urology | Admitting: Urology

## 2021-04-15 ENCOUNTER — Other Ambulatory Visit: Payer: Self-pay

## 2021-04-15 DIAGNOSIS — N2889 Other specified disorders of kidney and ureter: Secondary | ICD-10-CM | POA: Insufficient documentation

## 2021-04-15 MED ORDER — GADOBUTROL 1 MMOL/ML IV SOLN
7.0000 mL | Freq: Once | INTRAVENOUS | Status: AC | PRN
  Administered 2021-04-15: 7 mL via INTRAVENOUS

## 2021-04-17 ENCOUNTER — Other Ambulatory Visit: Payer: Self-pay | Admitting: Cardiovascular Disease

## 2021-04-17 ENCOUNTER — Other Ambulatory Visit: Payer: Self-pay | Admitting: *Deleted

## 2021-04-17 DIAGNOSIS — N2889 Other specified disorders of kidney and ureter: Secondary | ICD-10-CM

## 2021-04-17 NOTE — Telephone Encounter (Signed)
Lmovm to verify if pt is taking Furosemide 20 mg tablet. Medication not on pt's current medication list.  Please verify dose and instructions.

## 2021-04-17 NOTE — Progress Notes (Signed)
mri

## 2021-04-18 NOTE — Telephone Encounter (Signed)
Patient returning call .   He states he no longer takes furosemide and he does not need a refill.

## 2021-06-23 NOTE — Progress Notes (Deleted)
[place]  [phone] [phone]) [phone]  ID: James Oconnor OB: [birth year]  MR#: [id number]  [id number]  Patient Care Team: Dion Body, MD as PCP - General (Family Medicine) Minna Merritts, MD as PCP - Cardiology (Cardiology)  CHIEF COMPLAINT: Left renal mass, weight loss.  INTERVAL HISTORY: Patient returns to clinic today for repeat laboratory work and further evaluation. He continues to have chronic weakness and fatigue, but his appetite has improved and has gained weight in the interim. He has no neurologic complaints.  He denies any recent fevers or illnesses.  He has no chest pain, shortness of breath, cough, or hemoptysis.  He denies any nausea, vomiting, constipation, or diarrhea.  He has no urinary complaints. Patient offers no further specific complaints today.  REVIEW OF SYSTEMS:   Review of Systems  Constitutional:  Positive for malaise/fatigue. Negative for fever and weight loss.  Respiratory:  Negative for cough and shortness of breath.   Cardiovascular: Negative.  Negative for chest pain and leg swelling.  Gastrointestinal: Negative.  Negative for abdominal pain and constipation.  Genitourinary: Negative.  Negative for frequency and urgency.  Musculoskeletal: Negative.  Negative for back pain.  Skin: Negative.  Negative for rash.  Neurological:  Positive for weakness. Negative for dizziness, focal weakness and headaches.  Psychiatric/Behavioral: Negative.  The patient is not nervous/anxious.    As per HPI. Otherwise, a complete review of systems is negative.  PAST MEDICAL HISTORY: Past Medical History:  Diagnosis Date   BPH (benign prostatic hyperplasia)    Hypertension    Overactive bladder    Prostate enlargement    UTI (urinary tract infection)     PAST SURGICAL HISTORY: Past Surgical History:  Procedure Laterality Date   COLONOSCOPY WITH PROPOFOL N/A 11/30/2019   Procedure: COLONOSCOPY WITH PROPOFOL;  Surgeon:  Toledo, Benay Pike, MD;  Location: [facility];  Service: Gastroenterology;  Laterality: N/A;   ESOPHAGOGASTRODUODENOSCOPY (EGD) WITH PROPOFOL N/A 11/30/2019   Procedure: ESOPHAGOGASTRODUODENOSCOPY (EGD) WITH PROPOFOL;  Surgeon: Toledo, Benay Pike, MD;  Location: [facility];  Service: Gastroenterology;  Laterality: N/A;   HOLEP-LASER ENUCLEATION OF THE PROSTATE WITH MORCELLATION N/A 10/13/2019   Procedure: HOLEP-LASER ENUCLEATION OF THE PROSTATE WITH MORCELLATION;  Surgeon: Billey Co, MD;  Location: [facility];  Service: Urology;  Laterality: N/A;   NO PAST SURGERIES     none      FAMILY HISTORY: Family History  Problem Relation Age of Onset   Lung cancer Father    Heart disease Sister    Lung cancer Brother    Throat cancer Brother     ADVANCED DIRECTIVES (Y/N):  N  HEALTH MAINTENANCE: Social History   Tobacco Use   Smoking status: Former    Years: 60.00    Types: Cigarettes    Quit date: 1957    Years since quitting: 65.6   Smokeless tobacco: Never   Tobacco comments:    quit smoking 65yr ago  Vaping Use   Vaping Use: Never used  Substance Use Topics   Alcohol use: Not Currently   Drug use: Not Currently     Colonoscopy:  PAP:  Bone density:  Lipid panel:  Allergies  Allergen Reactions   Oxybutynin     Current Outpatient Medications  Medication Sig Dispense Refill   donepezil (ARICEPT) 5 MG tablet Take 5 mg by mouth at bedtime.     No current facility-administered medications for this visit.    OBJECTIVE: There were no vitals filed for this visit.  There is no height or weight on file to calculate BMI.    ECOG FS:1 - Symptomatic but completely ambulatory  General: Thin, no acute distress. Eyes: Pink conjunctiva, anicteric sclera. HEENT: Normocephalic, moist mucous membranes. Lungs: No audible wheezing or coughing. Heart: Regular rate and rhythm. Abdomen: Soft, nontender, no obvious distention. Musculoskeletal: No edema, cyanosis, or  clubbing. Neuro: Alert, answering all questions appropriately. Cranial nerves grossly intact. Skin: No rashes or petechiae noted. Psych: Normal affect.   LAB RESULTS:  Lab Results  Component Value Date   NA 136 12/27/2020   K 3.9 12/27/2020   CL 100 12/27/2020   CO2 27 12/27/2020   GLUCOSE 88 12/27/2020   BUN 14 12/27/2020   CREATININE 0.96 12/27/2020   CALCIUM 8.5 (L) 12/27/2020   PROT 8.6 (H) 12/27/2020   ALBUMIN 3.6 12/27/2020   AST 24 12/27/2020   ALT 15 12/27/2020   ALKPHOS 61 12/27/2020   BILITOT 0.7 12/27/2020   GFRNONAA >60 12/27/2020   GFRAA 59 (L) 06/18/2020    Lab Results  Component Value Date   WBC 6.3 12/27/2020   NEUTROABS 2.7 12/27/2020   HGB 12.2 (L) 12/27/2020   HCT 37.1 (L) 12/27/2020   MCV 80.0 12/27/2020   PLT 326 12/27/2020     STUDIES: No results found.  ASSESSMENT: Left renal mass, weight loss.  PLAN:   1. Left renal mass: Patient's most recent CT scan on February 13, 2020 reviewed independently with essentially stable left renal mass measuring approximately 2.4 cm.  He continues to follow closely with urology. He has an MRI of the abdomen ordered, but this is yet to be scheduled. Continue simple observation. 2.  Weight loss: Improved. Previously, patient has had complete work-up including CT of the chest, abdomen, pelvis, colonoscopy, and EGD with no significant pathology. Appreciate dietary input. Continue follow-up with them as indicated. 3.  Constipation: Patient does not complain of this today.  Continue stool softeners daily and MiraLAX as needed. 4.  Urinary retention: Patient does not complain of this today. Continue follow-up with urology as scheduled. 5. Leukocytosis: Likely reactive, monitor. 6. Renal insufficiency: Mild, monitor.  Patient expressed understanding and was in agreement with this plan. He also understands that He can call clinic at any time with any questions, concerns, or complaints.    Lloyd Huger, MD    06/23/2021 10:40 PM

## 2021-06-25 ENCOUNTER — Other Ambulatory Visit: Payer: Self-pay

## 2021-06-25 DIAGNOSIS — N2889 Other specified disorders of kidney and ureter: Secondary | ICD-10-CM

## 2021-06-26 ENCOUNTER — Inpatient Hospital Stay: Payer: Medicare Other

## 2021-06-26 ENCOUNTER — Inpatient Hospital Stay: Payer: Medicare Other | Admitting: Oncology

## 2021-06-26 DIAGNOSIS — N2889 Other specified disorders of kidney and ureter: Secondary | ICD-10-CM

## 2021-07-18 NOTE — Progress Notes (Signed)
[place]  Telephone:(336) [phone] Fax:(336) [phone]  ID: James Oconnor OB: 1930-08-27  MR#: [id number]  [id number]  Patient Care Team: Dion Body, MD as PCP - General (Family Medicine) Minna Merritts, MD as PCP - Cardiology (Cardiology)  CHIEF COMPLAINT: Left renal mass, weight loss.  INTERVAL HISTORY: Patient returns to clinic today for routine 5-monthevaluation.  He currently feels well and is asymptomatic.  He does not complain of any weakness or fatigue.  He has a good appetite and his weight has remained stable.  He has no neurologic complaints.  He denies any recent fevers or illnesses.  He has no chest pain, shortness of breath, cough, or hemoptysis.  He denies any nausea, vomiting, constipation, or diarrhea.  He has no urinary complaints.  Patient offers no specific complaints today.  REVIEW OF SYSTEMS:   Review of Systems  Constitutional: Negative.  Negative for fever, malaise/fatigue and weight loss.  Respiratory: Negative.  Negative for cough and shortness of breath.   Cardiovascular: Negative.  Negative for chest pain and leg swelling.  Gastrointestinal: Negative.  Negative for abdominal pain and constipation.  Genitourinary: Negative.  Negative for frequency and urgency.  Musculoskeletal: Negative.  Negative for back pain.  Skin: Negative.  Negative for rash.  Neurological: Negative.  Negative for dizziness, focal weakness, weakness and headaches.  Psychiatric/Behavioral: Negative.  The patient is not nervous/anxious.    As per HPI. Otherwise, a complete review of systems is negative.  PAST MEDICAL HISTORY: Past Medical History:  Diagnosis Date   BPH (benign prostatic hyperplasia)    Hypertension    Overactive bladder    Prostate enlargement    UTI (urinary tract infection)     PAST SURGICAL HISTORY: Past Surgical History:  Procedure Laterality Date   COLONOSCOPY WITH PROPOFOL N/A 11/30/2019   Procedure: COLONOSCOPY WITH  PROPOFOL;  Surgeon: Toledo, TBenay Pike MD;  Location: [facility];  Service: Gastroenterology;  Laterality: N/A;   ESOPHAGOGASTRODUODENOSCOPY (EGD) WITH PROPOFOL N/A 11/30/2019   Procedure: ESOPHAGOGASTRODUODENOSCOPY (EGD) WITH PROPOFOL;  Surgeon: Toledo, TBenay Pike MD;  Location: [facility];  Service: Gastroenterology;  Laterality: N/A;   HOLEP-LASER ENUCLEATION OF THE PROSTATE WITH MORCELLATION N/A 10/13/2019   Procedure: HOLEP-LASER ENUCLEATION OF THE PROSTATE WITH MORCELLATION;  Surgeon: SBilley Co MD;  Location: [facility];  Service: Urology;  Laterality: N/A;   NO PAST SURGERIES     none      FAMILY HISTORY: Family History  Problem Relation Age of Onset   Lung cancer Father    Heart disease Sister    Lung cancer Brother    Throat cancer Brother     ADVANCED DIRECTIVES (Y/N):  N  HEALTH MAINTENANCE: Social History   Tobacco Use   Smoking status: Former    Years: 60.00    Types: Cigarettes    Quit date: 1957    Years since quitting: 65.6   Smokeless tobacco: Never   Tobacco comments:    quit smoking 665yrago  Vaping Use   Vaping Use: Never used  Substance Use Topics   Alcohol use: Not Currently   Drug use: Not Currently     Colonoscopy:  PAP:  Bone density:  Lipid panel:  Allergies  Allergen Reactions   Oxybutynin     Current Outpatient Medications  Medication Sig Dispense Refill   azithromycin (ZITHROMAX) 250 MG tablet Take by mouth.     omeprazole (PRILOSEC) 20 MG capsule Take 20 mg by mouth daily.     No current  facility-administered medications for this visit.    OBJECTIVE: Vitals:   07/24/21 1451  BP: 137/84  Pulse: 80  Resp: 16  Temp: (!) 96.4 F (35.8 C)  SpO2: 99%     Body mass index is 24.02 kg/m.    ECOG FS:0 - Asymptomatic  General: Thin, no acute distress. Eyes: Pink conjunctiva, anicteric sclera. HEENT: Normocephalic, moist mucous membranes. Lungs: No audible wheezing or coughing. Heart: Regular rate and  rhythm. Abdomen: Soft, nontender, no obvious distention. Musculoskeletal: No edema, cyanosis, or clubbing. Neuro: Alert, answering all questions appropriately. Cranial nerves grossly intact. Skin: No rashes or petechiae noted. Psych: Normal affect.   LAB RESULTS:  Lab Results  Component Value Date   NA 139 07/24/2021   K 3.9 07/24/2021   CL 103 07/24/2021   CO2 28 07/24/2021   GLUCOSE 99 07/24/2021   BUN 16 07/24/2021   CREATININE 1.07 07/24/2021   CALCIUM 8.5 (L) 07/24/2021   PROT 8.0 07/24/2021   ALBUMIN 3.5 07/24/2021   AST 23 07/24/2021   ALT 18 07/24/2021   ALKPHOS 68 07/24/2021   BILITOT 0.5 07/24/2021   GFRNONAA >60 07/24/2021   GFRAA 59 (L) 06/18/2020    Lab Results  Component Value Date   WBC 6.1 07/24/2021   NEUTROABS 2.9 07/24/2021   HGB 12.1 (L) 07/24/2021   HCT 38.1 (L) 07/24/2021   MCV 82.6 07/24/2021   PLT 355 07/24/2021     STUDIES: No results found.  ASSESSMENT: Left renal mass, weight loss.  PLAN:   1. Left renal mass: Imaging consistent with renal cell carcinoma.  Patient's most recent imaging with MRI on Apr 15, 2021 revealed essentially stable disease.  No intervention is needed at this time.  Continue close follow-up with urology as scheduled.  No further follow-up has been scheduled in the cancer center.   2.  Weight loss: Resolved.  Previously, patient has had complete work-up including CT of the chest, abdomen, pelvis, colonoscopy, and EGD with no significant pathology. Appreciate dietary input. 3.  Constipation: Patient does not complain of this today.  Continue stool softeners daily and MiraLAX as needed. 4.  Urinary retention: Patient does not complain of this today. Continue follow-up with urology as scheduled. 5. Leukocytosis: Resolved. 6. Renal insufficiency: Resolved.  Patient expressed understanding and was in agreement with this plan. He also understands that He can call clinic at any time with any questions, concerns, or  complaints.    Lloyd Huger, MD   07/24/2021 3:50 PM

## 2021-07-24 ENCOUNTER — Inpatient Hospital Stay: Payer: Medicare Other

## 2021-07-24 ENCOUNTER — Inpatient Hospital Stay: Payer: Medicare Other | Attending: Oncology | Admitting: Oncology

## 2021-07-24 ENCOUNTER — Encounter: Payer: Self-pay | Admitting: Oncology

## 2021-07-24 VITALS — BP 137/84 | HR 80 | Temp 96.4°F | Resp 16 | Wt 158.0 lb

## 2021-07-24 DIAGNOSIS — K5900 Constipation, unspecified: Secondary | ICD-10-CM | POA: Diagnosis not present

## 2021-07-24 DIAGNOSIS — R634 Abnormal weight loss: Secondary | ICD-10-CM | POA: Insufficient documentation

## 2021-07-24 DIAGNOSIS — R338 Other retention of urine: Secondary | ICD-10-CM | POA: Insufficient documentation

## 2021-07-24 DIAGNOSIS — N2889 Other specified disorders of kidney and ureter: Secondary | ICD-10-CM | POA: Diagnosis present

## 2021-07-24 LAB — CBC WITH DIFFERENTIAL/PLATELET
Abs Immature Granulocytes: 0.01 10*3/uL (ref 0.00–0.07)
Basophils Absolute: 0 10*3/uL (ref 0.0–0.1)
Basophils Relative: 1 %
Eosinophils Absolute: 0.4 10*3/uL (ref 0.0–0.5)
Eosinophils Relative: 6 %
HCT: 38.1 % — ABNORMAL LOW (ref 39.0–52.0)
Hemoglobin: 12.1 g/dL — ABNORMAL LOW (ref 13.0–17.0)
Immature Granulocytes: 0 %
Lymphocytes Relative: 37 %
Lymphs Abs: 2.3 10*3/uL (ref 0.7–4.0)
MCH: 26.2 pg (ref 26.0–34.0)
MCHC: 31.8 g/dL (ref 30.0–36.0)
MCV: 82.6 fL (ref 80.0–100.0)
Monocytes Absolute: 0.6 10*3/uL (ref 0.1–1.0)
Monocytes Relative: 10 %
Neutro Abs: 2.9 10*3/uL (ref 1.7–7.7)
Neutrophils Relative %: 46 %
Platelets: 355 10*3/uL (ref 150–400)
RBC: 4.61 MIL/uL (ref 4.22–5.81)
RDW: 14.4 % (ref 11.5–15.5)
WBC: 6.1 10*3/uL (ref 4.0–10.5)
nRBC: 0 % (ref 0.0–0.2)

## 2021-07-24 LAB — COMPREHENSIVE METABOLIC PANEL
ALT: 18 U/L (ref 0–44)
AST: 23 U/L (ref 15–41)
Albumin: 3.5 g/dL (ref 3.5–5.0)
Alkaline Phosphatase: 68 U/L (ref 38–126)
Anion gap: 8 (ref 5–15)
BUN: 16 mg/dL (ref 8–23)
CO2: 28 mmol/L (ref 22–32)
Calcium: 8.5 mg/dL — ABNORMAL LOW (ref 8.9–10.3)
Chloride: 103 mmol/L (ref 98–111)
Creatinine, Ser: 1.07 mg/dL (ref 0.61–1.24)
GFR, Estimated: >60 mL/min (ref >60)
Glucose, Bld: 99 mg/dL (ref 70–99)
Potassium: 3.9 mmol/L (ref 3.5–5.1)
Sodium: 139 mmol/L (ref 135–145)
Total Bilirubin: 0.5 mg/dL (ref 0.3–1.2)
Total Protein: 8 g/dL (ref 6.5–8.1)

## 2021-07-24 NOTE — Progress Notes (Signed)
Pt in for follow up, denies any concerns today. 

## 2021-10-17 ENCOUNTER — Ambulatory Visit: Payer: Medicare Other

## 2021-10-27 ENCOUNTER — Ambulatory Visit
Admission: RE | Admit: 2021-10-27 | Discharge: 2021-10-27 | Disposition: A | Discharge status: Home / Self Care | Payer: Medicare Other | Source: Ambulatory Visit | Attending: Urology | Admitting: Urology

## 2021-10-27 DIAGNOSIS — N2889 Other specified disorders of kidney and ureter: Secondary | ICD-10-CM | POA: Insufficient documentation

## 2021-10-27 MED ORDER — GADOBUTROL 1 MMOL/ML IV SOLN
7.0000 mL | Freq: Once | INTRAVENOUS | Status: AC | PRN
  Administered 2021-10-27: 09:00:00 7 mL via INTRAVENOUS

## 2022-03-23 ENCOUNTER — Other Ambulatory Visit: Payer: Self-pay | Admitting: Urology

## 2022-03-23 DIAGNOSIS — N2889 Other specified disorders of kidney and ureter: Secondary | ICD-10-CM

## 2022-03-24 ENCOUNTER — Telehealth: Payer: Self-pay

## 2022-03-24 NOTE — Telephone Encounter (Signed)
-----   Message from Nori Riis, PA-C sent at 03/23/2022 10:54 AM EDT ----- ?Please let James Oconnor know that it will be time in May for his repeat MRI of his kidneys to monitor his kidney cancer.  We will need to get him scheduled for an appointment in May.  I have placed the orders.   ?

## 2022-03-24 NOTE — Telephone Encounter (Signed)
Pt scheduled for MRI results. Pt confirmed.  ?

## 2022-04-21 ENCOUNTER — Ambulatory Visit
Admission: RE | Admit: 2022-04-21 | Discharge: 2022-04-21 | Disposition: A | Discharge status: Home / Self Care | Payer: Medicare Other | Source: Ambulatory Visit | Attending: Urology | Admitting: Urology

## 2022-04-21 DIAGNOSIS — N2889 Other specified disorders of kidney and ureter: Secondary | ICD-10-CM | POA: Diagnosis not present

## 2022-04-21 MED ORDER — GADOBUTROL 1 MMOL/ML IV SOLN
7.0000 mL | Freq: Once | INTRAVENOUS | Status: AC | PRN
  Administered 2022-04-21: 7 mL via INTRAVENOUS

## 2022-04-23 ENCOUNTER — Ambulatory Visit (INDEPENDENT_AMBULATORY_CARE_PROVIDER_SITE_OTHER): Payer: Medicare Other | Admitting: Urology

## 2022-04-23 ENCOUNTER — Encounter: Payer: Self-pay | Admitting: Urology

## 2022-04-23 VITALS — BP 136/82 | HR 87 | Ht 67.0 in | Wt 157.0 lb

## 2022-04-23 DIAGNOSIS — R972 Elevated prostate specific antigen [PSA]: Secondary | ICD-10-CM | POA: Diagnosis not present

## 2022-04-23 DIAGNOSIS — C642 Malignant neoplasm of left kidney, except renal pelvis: Secondary | ICD-10-CM

## 2022-04-23 DIAGNOSIS — N2889 Other specified disorders of kidney and ureter: Secondary | ICD-10-CM

## 2022-04-23 DIAGNOSIS — N138 Other obstructive and reflux uropathy: Secondary | ICD-10-CM

## 2022-04-23 NOTE — Progress Notes (Signed)
04/23/22 2:03 PM   [name] November 13, 1930 [id number]  Referring provider:  Dion Body, MD [facility] [place],  [place] [address]  Urological history  BPH with retention  -S/p HoLEP with removal 100 g tissue on 10/13/2019 with Dr Diamantina Providence   2. Elevated PSA  - PSA 7.2 [age] years age. Pathology from prostate chips obtained during the HoLEP in November 2020 were negative for malignancy   3. Left RCC  - MRI 2020 - In the anterior aspect of the upper pole the left kidney there is a 2.6 x 2.2 x 3.1 cm lesion that is heterogeneous in signal intensity on T1 weighted images (T1 isointense to T1 hyperintense), isointense on T2 weighted images, with avid arterial phase enhancement and some delayed washout, compatible with a solid renal neoplasm, likely a renal cell carcinoma. This appears encapsulated within Gerota's fascia, and is well separated from the left renal vein, which is widely patent. -MRI 2023 - Exophytic mass at the upper pole left kidney which demonstrates hemorrhagic cystic component and solid enhancing component again seen now measuring 2.7 x 2.5 x 3.2 cm  HPI:  Luman Holway is a 88 y.o.male who presents today for MRI results.   He underwent MRI on 04/21/2022 that visualized . exophytic mass at the upper pole left kidney which demonstrates hemorrhagic cystic component and solid enhancing component again seen now measuring 2.7 x 2.5 x 3.2 cm. Multiple additional renal cysts identified bilaterally measuring up to 3.3 cm anteriorly on the right and 4.5 cm posteriorly on the left. No hydronephrosis identified.  He reports that the has to wake up x2 nightly to void. This has been ongoing. He does not restrict fluids before bedtime.   Patient denies any modifying or aggravating factors.  Patient denies any gross hematuria, dysuria or suprapubic/flank pain.  Patient denies any fevers, chills, nausea or vomiting.    PMH: Past Medical History:   Diagnosis Date   BPH (benign prostatic hyperplasia)    Hypertension    Overactive bladder    Prostate enlargement    UTI (urinary tract infection)     Surgical History: Past Surgical History:  Procedure Laterality Date   COLONOSCOPY WITH PROPOFOL N/A 11/30/2019   Procedure: COLONOSCOPY WITH PROPOFOL;  Surgeon: Toledo, Benay Pike, MD;  Location: [facility];  Service: Gastroenterology;  Laterality: N/A;   ESOPHAGOGASTRODUODENOSCOPY (EGD) WITH PROPOFOL N/A 11/30/2019   Procedure: ESOPHAGOGASTRODUODENOSCOPY (EGD) WITH PROPOFOL;  Surgeon: Toledo, Benay Pike, MD;  Location: [facility];  Service: Gastroenterology;  Laterality: N/A;   HOLEP-LASER ENUCLEATION OF THE PROSTATE WITH MORCELLATION N/A 10/13/2019   Procedure: HOLEP-LASER ENUCLEATION OF THE PROSTATE WITH MORCELLATION;  Surgeon: Billey Co, MD;  Location: [facility];  Service: Urology;  Laterality: N/A;   NO PAST SURGERIES     none      Home Medications:  Allergies as of 04/23/2022       Reactions   Oxybutynin         Medication List        Accurate as of Apr 23, 2022  2:03 PM. If you have any questions, ask your nurse or doctor.          STOP taking these medications    azithromycin 250 MG tablet Commonly known as: ZITHROMAX Stopped by: Zara Council, PA-C       TAKE these medications    omeprazole 20 MG capsule Commonly known as: PRILOSEC Take 20 mg by mouth daily.  Allergies:  Allergies  Allergen Reactions   Oxybutynin     Family History: Family History  Problem Relation Age of Onset   Lung cancer Father    Heart disease Sister    Lung cancer Brother    Throat cancer Brother     Social History:  reports that he quit smoking about 66 years ago. His smoking use included cigarettes. He has never used smokeless tobacco. He reports that he does not currently use alcohol. He reports that he does not currently use drugs.   Physical Exam: BP 136/82   Pulse 87   Ht '5\' 7"'$   (1.702 m)   Wt 157 lb (71.2 kg)   BMI 24.59 kg/m   Constitutional:  Well nourished. Alert and oriented, No acute distress. HEENT: [place] AT, moist mucus membranes.  Trachea midline Cardiovascular: No clubbing, cyanosis, or edema. Respiratory: Normal respiratory effort, no increased work of breathing. Neurologic: Grossly intact, no focal deficits, moving all 4 extremities. Psychiatric: Normal mood and affect.   Laboratory Data: Component     Latest Ref Rng 07/24/2021  Sodium     135 - 145 mmol/L 139   Potassium     3.5 - 5.1 mmol/L 3.9   Chloride     98 - 111 mmol/L 103   CO2     22 - 32 mmol/L 28   Glucose     70 - 99 mg/dL 99   BUN     8 - 23 mg/dL 16   Creatinine     0.61 - 1.24 mg/dL 1.07   Calcium     8.9 - 10.3 mg/dL 8.5 (L)   Anion gap     5 - 15  8   Total Protein     6.5 - 8.1 g/dL 8.0   Albumin     3.5 - 5.0 g/dL 3.5   AST     15 - 41 U/L 23   ALT     0 - 44 U/L 18   Alkaline Phosphatase     38 - 126 U/L 68   Total Bilirubin     0.3 - 1.2 mg/dL 0.5   GFR, Estimated     >60 mL/min >60     Component     Latest Ref Rng 07/24/2021  WBC     4.0 - 10.5 K/uL 6.1   RBC     4.22 - 5.81 MIL/uL 4.61   Hemoglobin     13.0 - 17.0 g/dL 12.1 (L)   HCT     39.0 - 52.0 % 38.1 (L)   MCV     80.0 - 100.0 fL 82.6   MCH     26.0 - 34.0 pg 26.2   MCHC     30.0 - 36.0 g/dL 31.8   RDW     11.5 - 15.5 % 14.4   Platelets     150 - 400 K/uL 355   nRBC     0.0 - 0.2 % 0.0   Neutrophils     % 46   NEUT#     1.7 - 7.7 K/uL 2.9   Lymphocytes     % 37   Lymphocyte #     0.7 - 4.0 K/uL 2.3   Monocytes Relative     % 10   Monocyte #     0.1 - 1.0 K/uL 0.6   Eosinophil     % 6   Eosinophils Absolute     0.0 - 0.5  K/uL 0.4   Basophil     % 1   Basophils Absolute     0.0 - 0.1 K/uL 0.0   Immature Granulocytes     % 0   Abs Immature Granulocytes     0.00 - 0.07 K/uL 0.01   WBC, UA     0 - 5 /hpf   Epithelial Cells (non renal)     0 - 10 /hpf   Crystals      N/A    Crystal Type     N/A    Bacteria, UA     None seen/Few     I have reviewed the labs.   Pertinent Imaging: CLINICAL DATA:  Renal mass follow-up   EXAM: MRI ABDOMEN WITHOUT AND WITH CONTRAST   TECHNIQUE: Multiplanar multisequence MR imaging of the abdomen was performed both before and after the administration of intravenous contrast.   CONTRAST:  82m GADAVIST GADOBUTROL 1 MMOL/ML IV SOLN   COMPARISON:  MRI abdomen 10/27/2021   FINDINGS: Lower chest: Ground-glass increased T2 signal intensities in the bilateral lung bases which appear to be chronic. Cardiomegaly.   Hepatobiliary: Large simple appearing central hepatic cyst again seen measuring 9.3 x 8.3 cm. No suspicious hepatic mass identified. Gallbladder appears within normal limits. No biliary ductal dilatation identified.   Pancreas: No mass, inflammatory changes, or other parenchymal abnormality identified.   Spleen:  Within normal limits in size and appearance.   Adrenals/Urinary Tract: Adrenal glands are stable and within normal limits. Exophytic mass at the upper pole left kidney which demonstrates hemorrhagic cystic component and solid enhancing component again seen now measuring 2.7 x 2.5 x 3.2 cm. Multiple additional renal cysts identified bilaterally measuring up to 3.3 cm anteriorly on the right and 4.5 cm posteriorly on the left. No hydronephrosis identified.   Stomach/Bowel: Colonic diverticulosis. No evidence of bowel obstruction.   Vascular/Lymphatic: No pathologically enlarged lymph nodes identified. No definite renal venous tumor involvement. No abdominal aortic aneurysm demonstrated.   Other:  No ascites.   Musculoskeletal: No suspicious bone lesions identified.   IMPRESSION: 1. Complex enhancing mass at the upper pole left kidney again seen consistent with RCC, mildly increased in size since previous study. 2. No lymphadenopathy or renal vein involvement visualized. 3. Multiple  bilateral renal cysts. 4. Large hepatic cyst. 5. Colonic diverticulosis. 6. Chronic pulmonary fibrotic changes.  Cardiomegaly.     Electronically Signed   By: DOfilia NeasM.D.   On: 04/22/2022 09:46 I have independently reviewed the films.  See HPI.      Assessment & Plan:    RCC - 3 cm left renal mass concerning for renal cell carcinoma pursing active surveillance -minuscule increase in size since 2020 - reviewed that the risk of metastasis for renal masses less than 3-4 cm is small (up to approximately 5%) and considering his advanced age would recommend to continue with active surveillance which he is agreeable   2. Urinary retention  - S/P HoLEP on 10/13/2019 -voiding well   3. BPH with obstruction/lower urinary tract symptoms - Continue conservative management, avoiding bladder irritants and timed voiding's  Return in about 6 months (around 10/24/2022) for Mri report .  [address] [place]  [phone] I, KKirke ShaggyLittlejohn,acting as a scribe for [facility] PA-C.,have documented all relevant documentation on the behalf of Savina Olshefski, PA-C,as directed by  S[facility] PA-C while in the presence of [place] PA-C.  I have  reviewed the above documentation for accuracy and completeness, and I agree with the above.    Zara Council, PA-C   I spent 15 minutes on the day of the encounter to include pre-visit record review, face-to-face time with the patient, and post-visit ordering of tests.

## 2022-06-01 DIAGNOSIS — K219 Gastro-esophageal reflux disease without esophagitis: Secondary | ICD-10-CM | POA: Insufficient documentation

## 2022-08-09 NOTE — Progress Notes (Unsigned)
Cardiology Office Note  Date:  08/10/2022   ID:  James Oconnor, DOB March 12, 1930, MRN [id number]  PCP:  Dion Body, MD   Chief Complaint  Patient presents with   12 month follow up    Patient c/o chest indigestion at times. Medications reviewed by the patient verbally.     HPI:  James Oconnor is a 88 y.o. 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, unchanged in 2021 probable RCC,  bladder outlet obstruction,  Dm2,  HTN,  Dysphagia, ILD,  anemia,  renal stones  CT scan minimal coronary calcification, mild aortic atherosclerosis presents for f/u of his follow-up of aortic arch dilation and HTN  Last seen by myself in clinic December 2021 Followed by urology for left RCC MRI 2023 - Exophytic mass at the upper pole left kidney which demonstrates hemorrhagic cystic component and solid enhancing component again seen now measuring 2.7 x 2.5 x 3.2 cm -Surveillance being recommended  Had weight loss Down 10 pounds since 2021  Labs reviewed Glu 83 Total chol 164, LDL 97  EKG personally reviewed by myself on todays visit Normal sinus rhythm rate 73 bpm no significant ST-T wave changes  Prior imaging reviewed   Echo with normal LV SF with EF 60-65%, mild LVH, DD, normal RVSF and cavity size, mild MR.    CT scan 02/13/2020.  Aorta 4.1 cm  Echo 02/08/2020 with LVEF 55%, mild AV stenosis and regurgitation, normal RV systolic function   PMH:   has a past medical history of BPH (benign prostatic hyperplasia), Hypertension, Overactive bladder, Prostate enlargement, and UTI (urinary tract infection).  PSH:    Past Surgical History:  Procedure Laterality Date   COLONOSCOPY WITH PROPOFOL N/A 11/30/2019   Procedure: COLONOSCOPY WITH PROPOFOL;  Surgeon: Toledo, Benay Pike, MD;  Location: [facility];  Service: Gastroenterology;  Laterality: N/A;   ESOPHAGOGASTRODUODENOSCOPY (EGD) WITH PROPOFOL N/A 11/30/2019   Procedure: ESOPHAGOGASTRODUODENOSCOPY (EGD) WITH  PROPOFOL;  Surgeon: Toledo, Benay Pike, MD;  Location: [facility];  Service: Gastroenterology;  Laterality: N/A;   HOLEP-LASER ENUCLEATION OF THE PROSTATE WITH MORCELLATION N/A 10/13/2019   Procedure: HOLEP-LASER ENUCLEATION OF THE PROSTATE WITH MORCELLATION;  Surgeon: Billey Co, MD;  Location: [facility];  Service: Urology;  Laterality: N/A;   NO PAST SURGERIES     none      No current outpatient medications on file.   No current facility-administered medications for this visit.     Allergies:   Oxybutynin   Social History:  The patient  reports that he quit smoking about 66 years ago. His smoking use included cigarettes. He has never used smokeless tobacco. He reports that he does not currently use alcohol. He reports that he does not currently use drugs.   Family History:   family history includes Heart disease in his sister; Lung cancer in his brother and father; Throat cancer in his brother.    Review of Systems: Review of Systems  Constitutional: Negative.   HENT: Negative.    Respiratory: Negative.    Cardiovascular: Negative.   Gastrointestinal: Negative.   Musculoskeletal:  Positive for neck pain.  Neurological: Negative.   Psychiatric/Behavioral: Negative.    All other systems reviewed and are negative.   PHYSICAL EXAM: VS:  BP 130/86 (BP Location: Left Arm, Patient Position: Sitting, Cuff Size: Normal)   Pulse 86   Ht 5' 7.5" (1.715 m)   Wt 151 lb (68.5 kg)   BMI 23.30 kg/m  , BMI  Body mass index is 23.3 kg/m. Constitutional:  oriented to person, place, and time. No distress.  HENT:  Head: Grossly normal Eyes:  no discharge. No scleral icterus.  Neck: No JVD, no carotid bruits  Cardiovascular: Regular rate and rhythm, no murmurs appreciated Pulmonary/Chest: Clear to auscultation bilaterally, no wheezes or rails Abdominal: Soft.  no distension.  no tenderness.  Musculoskeletal: Normal range of motion Neurological:  normal muscle tone. Coordination  normal. No atrophy Skin: Skin warm and dry Psychiatric: normal affect, pleasant  Recent Labs: No results found for requested labs within last 365 days.    Lipid Panel Lab Results  Component Value Date   CHOL 144 08/25/2019   HDL 55 08/25/2019   LDLCALC 74 08/25/2019   TRIG 75 08/25/2019      Wt Readings from Last 3 Encounters:  08/10/22 151 lb (68.5 kg)  04/23/22 157 lb (71.2 kg)  07/24/21 158 lb (71.7 kg)     ASSESSMENT AND PLAN:  Problem List Items Addressed This Visit       Cardiology Problems   Essential hypertension   Other Visit Diagnoses     Ascending aorta dilation (HCC)    -  Primary   Mild aortic stenosis       Demand ischemia (HCC)          Acute diastolic CHF Appears euvolemic, weight down 10 pounds from prior clinic visit Currently with no medications on his list, not taking Lasix Moderates fluid intake Denies shortness of breath on exertion  Dilated aorta Medical management Not surgical candidate if there is progression of his aorta He is also indicated he would not want surgery  Diabetes type 2 Glucose numbers well controlled, 10 pound weight loss since last clinic visit  Essential hypertension Blood pressure relatively well controlled, currently not on medications Which he just made   Total encounter time more than 30 minutes  Greater than 50% was spent in counseling and coordination of care with the patient    Signed, Esmond Plants, M.D., Ph.D. [place], [place]

## 2022-08-10 ENCOUNTER — Encounter: Payer: Self-pay | Admitting: Cardiovascular Disease

## 2022-08-10 ENCOUNTER — Ambulatory Visit: Payer: Medicare Other | Attending: Cardiovascular Disease | Admitting: Cardiovascular Disease

## 2022-08-10 VITALS — BP 130/86 | HR 86 | Ht 67.5 in | Wt 151.0 lb

## 2022-08-10 DIAGNOSIS — I10 Essential (primary) hypertension: Secondary | ICD-10-CM

## 2022-08-10 DIAGNOSIS — C649 Malignant neoplasm of unspecified kidney, except renal pelvis: Secondary | ICD-10-CM | POA: Diagnosis not present

## 2022-08-10 DIAGNOSIS — I350 Nonrheumatic aortic (valve) stenosis: Secondary | ICD-10-CM

## 2022-08-10 DIAGNOSIS — I248 Other forms of acute ischemic heart disease: Secondary | ICD-10-CM

## 2022-08-10 DIAGNOSIS — I77810 Thoracic aortic ectasia: Secondary | ICD-10-CM

## 2022-08-10 NOTE — Patient Instructions (Signed)
Medication Instructions:  No changes  If you need a refill on your cardiac medications before your next appointment, please call your pharmacy.   Lab work: No new labs needed  Testing/Procedures: No new testing needed  Follow-Up: At [facility], you and your health needs are our priority.  As part of our continuing mission to provide you with exceptional heart care, we have created designated Provider Care Teams.  These Care Teams include your primary Cardiologist (physician) and Advanced Practice Providers (APPs -  Physician Assistants and Nurse Practitioners) who all work together to provide you with the care you need, when you need it.  You will need a follow up appointment in 12 months  Providers on your designated Care Team:   Christopher Berge, NP Ryan Dunn, PA-C Cadence Furth, PA-C  COVID-19 Vaccine Information can be found at: [email or web address] For questions related to vaccine distribution or appointments, please email [email or web address] or call [phone].   

## 2022-09-03 DIAGNOSIS — R0602 Shortness of breath: Secondary | ICD-10-CM | POA: Diagnosis not present

## 2022-10-20 ENCOUNTER — Ambulatory Visit
Admission: RE | Admit: 2022-10-20 | Discharge: 2022-10-20 | Disposition: A | Discharge status: Home / Self Care | Payer: Medicare Other | Source: Ambulatory Visit | Attending: Urology | Admitting: Urology

## 2022-10-20 DIAGNOSIS — C642 Malignant neoplasm of left kidney, except renal pelvis: Secondary | ICD-10-CM | POA: Diagnosis not present

## 2022-10-20 MED ORDER — GADOBUTROL 1 MMOL/ML IV SOLN
6.0000 mL | Freq: Once | INTRAVENOUS | Status: AC | PRN
  Administered 2022-10-20: 6 mL via INTRAVENOUS

## 2022-10-26 NOTE — Progress Notes (Unsigned)
10/27/22 2:10 PM   James Oconnor [birth year] [id number]  Referring provider:  Dion Body, MD [place] [facility] [place],  [place] [address]  Urological history  BPH with retention  -S/p HoLEP with removal 100 g tissue on 10/13/2019 with Dr Diamantina Providence  -I PSS 16/1   2. Elevated PSA  - PSA 7.2 [age] years age. Pathology from prostate chips obtained during the HoLEP in November 2020 were negative for malignancy   3. Left RCC  - MRI 2020 - In the anterior aspect of the upper pole the left kidney there is a 2.6 x 2.2 x 3.1 cm lesion that is heterogeneous in signal intensity on T1 weighted images (T1 isointense to T1 hyperintense), isointense on T2 weighted images, with avid arterial phase enhancement and some delayed washout, compatible with a solid renal neoplasm, likely a renal cell carcinoma. This appears encapsulated within Gerota's fascia, and is well separated from the left renal vein, which is widely patent. -MRI (03/2022) - Exophytic mass at the upper pole left kidney which demonstrates hemorrhagic cystic component and solid enhancing component again seen now measuring 2.7 x 2.5 x 3.2 cm -MRI (09/2022) - Minimally increased size of the heterogeneously arterially enhancing upper pole left renal lesion with internal hemorrhage, now measuring 3.5 cm, suspicious for renal cell carcinoma.   HPI:  James Oconnor is a 88 y.o.male who presents today for MRI results and 6 month follow up.   MRI 09/2022 - enhancing upper pole left renal lesion with internal hemorrhage measures 3.5 x 3.0 x 3.5 cm (13:32, 9:30), Minimally increased in size from 04/21/2022 when it measured 3.2 x 2.9 x 3.5 cm (remeasured)) and slowly increasing in size from 2.7 x 2.7 x 3.2 cm on 08/25/2019  I PSS 16/1  He has nocturia x 3.  Patient denies any modifying or aggravating factors.  Patient denies any gross hematuria, dysuria or suprapubic/flank pain.  Patient denies any fevers, chills, nausea or  vomiting.     IPSS     Row Name 10/27/22 1300         International Prostate Symptom Score   How often have you had the sensation of not emptying your bladder? Less than 1 in 5     How often have you had to urinate less than every two hours? About half the time     How often have you found you stopped and started again several times when you urinated? Less than half the time     How often have you found it difficult to postpone urination? Less than half the time     How often have you had a weak urinary stream? Less than half the time     How often have you had to strain to start urination? Less than half the time     How many times did you typically get up at night to urinate? 4 Times     Total IPSS Score 16       Quality of Life due to urinary symptoms   If you were to spend the rest of your life with your urinary condition just the way it is now how would you feel about that? Pleased              Score:  1-7 Mild 8-19 Moderate 20-35 Severe   PMH: Past Medical History:  Diagnosis Date   BPH (benign prostatic hyperplasia)    Hypertension    Overactive bladder    Prostate enlargement  UTI (urinary tract infection)     Surgical History: Past Surgical History:  Procedure Laterality Date   COLONOSCOPY WITH PROPOFOL N/A 11/30/2019   Procedure: COLONOSCOPY WITH PROPOFOL;  Surgeon: Toledo, Benay Pike, MD;  Location: [facility];  Service: Gastroenterology;  Laterality: N/A;   ESOPHAGOGASTRODUODENOSCOPY (EGD) WITH PROPOFOL N/A 11/30/2019   Procedure: ESOPHAGOGASTRODUODENOSCOPY (EGD) WITH PROPOFOL;  Surgeon: Toledo, Benay Pike, MD;  Location: [facility];  Service: Gastroenterology;  Laterality: N/A;   HOLEP-LASER ENUCLEATION OF THE PROSTATE WITH MORCELLATION N/A 10/13/2019   Procedure: HOLEP-LASER ENUCLEATION OF THE PROSTATE WITH MORCELLATION;  Surgeon: Billey Co, MD;  Location: [facility];  Service: Urology;  Laterality: N/A;   NO PAST SURGERIES     none       Home Medications:  Allergies as of 10/27/2022       Reactions   Oxybutynin         Medication List        Accurate as of October 27, 2022  2:10 PM. If you have any questions, ask your nurse or doctor.          omeprazole 40 MG capsule Commonly known as: PRILOSEC Take by mouth.        Allergies:  Allergies  Allergen Reactions   Oxybutynin     Family History: Family History  Problem Relation Age of Onset   Lung cancer Father    Heart disease Sister    Lung cancer Brother    Throat cancer Brother     Social History:  reports that he quit smoking about 66 years ago. His smoking use included cigarettes. He has never used smokeless tobacco. He reports that he does not currently use alcohol. He reports that he does not currently use drugs.   Physical Exam: BP (!) 160/94   Pulse 85   Ht '5\' 7"'$  (1.702 m)   Wt 150 lb (68 kg)   BMI 23.49 kg/m   Constitutional:  Well nourished. Alert and oriented, No acute distress. HEENT:  AT, moist mucus membranes.  Trachea midline Cardiovascular: No clubbing, cyanosis, or edema. Respiratory: Normal respiratory effort, no increased work of breathing. Neurologic: Grossly intact, no focal deficits, moving all 4 extremities. Psychiatric: Normal mood and affect.   Laboratory Data: Serum creatinine (04/2022) 1.1 I have reviewed the labs.   Pertinent Imaging: CLINICAL DATA:  Follow-up of renal mass   EXAM: MRI ABDOMEN WITHOUT AND WITH CONTRAST   TECHNIQUE: Multiplanar multisequence MR imaging of the abdomen was performed both before and after the administration of intravenous contrast.   CONTRAST:  38m GADAVIST GADOBUTROL 1 MMOL/ML IV SOLN   COMPARISON:  MRI abdomen dated 04/21/2022 and multiple priors dating back to 08/25/2019   FINDINGS: Lower chest: No acute findings. Increased bibasilar T2 hyperintensities.   Hepatobiliary: Similar 9.4 x 8.2 cm right hepatic lobe cyst. 4 mm cyst in segment 8 (15:25) is  also unchanged. s No new focal hepatic lesions. No bile duct dilation. Normal gallbladder.   Pancreas: 3 mm T2 hyperintense cystic foci in the pancreatic body (3:18) with direct connection to the main pancreatic duct, unchanged from 10/02/2020, likely side branch side branch intraductal papillary mucinous neoplasms (IPMN). No main ductal dilation. No solid enhancing component.   Spleen:  Within normal limits in size and appearance.   Adrenals/Urinary Tract: No adrenal nodules. Heterogeneously arterially enhancing upper pole left renal lesion with internal hemorrhage measures 3.5 x 3.0 x 3.5 cm (13:32, 9:30), Minimally increased in size from 04/21/2022 when it measured 3.2  x 2.9 x 3.5 cm (remeasured)) and slowly increasing in size from 2.7 x 2.7 x 3.2 cm on 08/25/2019 (remeasured). Questionable area in the lower pole left kidney (13:58) without correlate on additional sequences may represent an area of renal tissue with multiple adjacent small cysts. No substantial change in multiple bilateral cysts.   Stomach/Bowel: Visualized portions within the abdomen are unremarkable.   Vascular/Lymphatic: No pathologically enlarged lymph nodes identified. No abdominal aortic aneurysm demonstrated. Aortic atherosclerosis. Patent renal veins.   Other:  None.   Musculoskeletal: No suspicious bone lesions identified.   IMPRESSION: 1. Minimally increased size of the heterogeneously arterially enhancing upper pole left renal lesion with internal hemorrhage, now measuring 3.5 cm, suspicious for renal cell carcinoma. 2. No evidence of metastatic disease in the abdomen. No renal venous involvement. 3. Increased bibasilar T2 hyperintensities, likely atelectasis and chronic fibrotic changes. 4. Unchanged 3 mm cystic foci in the pancreatic body dating back to 10/02/2020 with direct connection to the main pancreatic duct, likely side branch intraductal papillary mucinous neoplasms (IPMN). No main  ductal dilation or solid enhancing component.     Electronically Signed   By: Darrin Nipper M.D.   On: 10/21/2022 09:07 I have independently reviewed the films.  See HPI.      Assessment & Plan:    RCC - 3.5 cm left renal mass concerning for renal cell carcinoma pursing active surveillance - slight increase in size since 2020 - reviewed that the risk of metastasis for renal masses less than 3-4 cm is small (up to approximately 5%) and considering his advanced age would recommend to continue with active surveillance or referral to IR for cryoablation of the lesion - he wold like to continue active surveillance    2. Urinary retention  - S/P HoLEP on 10/13/2019 -voiding well   3. BPH with obstruction/lower urinary tract symptoms - Continue conservative management, avoiding bladder irritants and timed voiding's  4. Nocturia -Explained that this is multifactorial in nature and he does not find it bothersome at this time so he does not desire any further work-up or medication  Return in about 6 months (around 04/27/2023) for mri of kidney and ipss .  Piera [place]s, [place] [address], [place], [place] [address] [phone]

## 2022-10-27 ENCOUNTER — Ambulatory Visit (INDEPENDENT_AMBULATORY_CARE_PROVIDER_SITE_OTHER): Payer: Medicare Other | Admitting: Urology

## 2022-10-27 ENCOUNTER — Encounter: Payer: Self-pay | Admitting: Urology

## 2022-10-27 VITALS — BP 160/94 | HR 85 | Ht 67.0 in | Wt 150.0 lb

## 2022-10-27 DIAGNOSIS — N138 Other obstructive and reflux uropathy: Secondary | ICD-10-CM

## 2022-10-27 DIAGNOSIS — N2889 Other specified disorders of kidney and ureter: Secondary | ICD-10-CM | POA: Diagnosis not present

## 2022-10-27 DIAGNOSIS — R351 Nocturia: Secondary | ICD-10-CM

## 2022-10-27 DIAGNOSIS — N401 Enlarged prostate with lower urinary tract symptoms: Secondary | ICD-10-CM | POA: Diagnosis not present

## 2022-10-27 DIAGNOSIS — C642 Malignant neoplasm of left kidney, except renal pelvis: Secondary | ICD-10-CM

## 2022-10-27 NOTE — Patient Instructions (Signed)
[facility] [address]  [place], [place] [address] [place], [place], [place] [address] [place] [address], [place], [place] [address] [phone]

## 2023-01-18 DIAGNOSIS — Z961 Presence of intraocular lens: Secondary | ICD-10-CM | POA: Diagnosis not present

## 2023-01-18 DIAGNOSIS — K219 Gastro-esophageal reflux disease without esophagitis: Secondary | ICD-10-CM | POA: Diagnosis not present

## 2023-01-18 DIAGNOSIS — Z79899 Other long term (current) drug therapy: Secondary | ICD-10-CM | POA: Diagnosis not present

## 2023-01-18 DIAGNOSIS — I10 Essential (primary) hypertension: Secondary | ICD-10-CM | POA: Diagnosis not present

## 2023-01-18 DIAGNOSIS — Z9841 Cataract extraction status, right eye: Secondary | ICD-10-CM | POA: Diagnosis not present

## 2023-01-18 DIAGNOSIS — H21562 Pupillary abnormality, left eye: Secondary | ICD-10-CM | POA: Diagnosis not present

## 2023-01-18 DIAGNOSIS — Z85528 Personal history of other malignant neoplasm of kidney: Secondary | ICD-10-CM | POA: Diagnosis not present

## 2023-01-18 DIAGNOSIS — H2512 Age-related nuclear cataract, left eye: Secondary | ICD-10-CM | POA: Diagnosis not present

## 2023-02-21 ENCOUNTER — Other Ambulatory Visit: Payer: Self-pay

## 2023-02-21 ENCOUNTER — Emergency Department: Payer: Medicare Other

## 2023-02-21 ENCOUNTER — Emergency Department
Admission: EM | Admit: 2023-02-21 | Discharge: 2023-02-21 | Disposition: A | Discharge status: Home / Self Care | Payer: Medicare Other | Attending: Emergency Medicine | Admitting: Emergency Medicine

## 2023-02-21 DIAGNOSIS — R109 Unspecified abdominal pain: Secondary | ICD-10-CM

## 2023-02-21 LAB — CBC
HCT: 41.1 % (ref 39.0–52.0)
Hemoglobin: 13.1 g/dL (ref 13.0–17.0)
MCH: 26.3 pg (ref 26.0–34.0)
MCHC: 31.9 g/dL (ref 30.0–36.0)
MCV: 82.4 fL (ref 80.0–100.0)
Platelets: 328 10*3/uL (ref 150–400)
RBC: 4.99 MIL/uL (ref 4.22–5.81)
RDW: 13.9 % (ref 11.5–15.5)
WBC: 7.3 10*3/uL (ref 4.0–10.5)
nRBC: 0 % (ref 0.0–0.2)

## 2023-02-21 LAB — URINALYSIS, ROUTINE W REFLEX MICROSCOPIC
Bilirubin Urine: NEGATIVE
Glucose, UA: NEGATIVE mg/dL
Hgb urine dipstick: NEGATIVE
Ketones, ur: NEGATIVE mg/dL
Leukocytes,Ua: NEGATIVE
Nitrite: NEGATIVE
Protein, ur: NEGATIVE mg/dL
Specific Gravity, Urine: 1.028 (ref 1.005–1.030)
pH: 7 (ref 5.0–8.0)

## 2023-02-21 LAB — COMPREHENSIVE METABOLIC PANEL
ALT: 32 U/L (ref 0–44)
AST: 33 U/L (ref 15–41)
Albumin: 3.5 g/dL (ref 3.5–5.0)
Alkaline Phosphatase: 96 U/L (ref 38–126)
Anion gap: 5 (ref 5–15)
BUN: 15 mg/dL (ref 8–23)
CO2: 25 mmol/L (ref 22–32)
Calcium: 8.2 mg/dL — ABNORMAL LOW (ref 8.9–10.3)
Chloride: 104 mmol/L (ref 98–111)
Creatinine, Ser: 1.1 mg/dL (ref 0.61–1.24)
GFR, Estimated: >60 mL/min (ref >60)
Glucose, Bld: 142 mg/dL — ABNORMAL HIGH (ref 70–99)
Potassium: 3.6 mmol/L (ref 3.5–5.1)
Sodium: 134 mmol/L — ABNORMAL LOW (ref 135–145)
Total Bilirubin: 0.6 mg/dL (ref 0.3–1.2)
Total Protein: 8.9 g/dL — ABNORMAL HIGH (ref 6.5–8.1)

## 2023-02-21 LAB — LIPASE, BLOOD: Lipase: 29 U/L (ref 11–51)

## 2023-02-21 MED ORDER — SUCRALFATE 1 G PO TABS: 1.0000 g | ORAL_TABLET | Freq: Four times a day (QID) | ORAL | 1 refills | Status: DC

## 2023-02-21 MED ORDER — ALUM & MAG HYDROXIDE-SIMETH 200-200-20 MG/5ML PO SUSP
30.0000 mL | Freq: Once | ORAL | Status: AC
  Administered 2023-02-21: 30 mL via ORAL
  Filled 2023-02-21: qty 30

## 2023-02-21 MED ORDER — IOHEXOL 300 MG/ML  SOLN
100.0000 mL | Freq: Once | INTRAMUSCULAR | Status: AC | PRN
  Administered 2023-02-21: 100 mL via INTRAVENOUS

## 2023-02-21 MED ORDER — LIDOCAINE VISCOUS HCL 2 % MT SOLN
15.0000 mL | Freq: Once | OROMUCOSAL | Status: AC
  Administered 2023-02-21: 15 mL via ORAL
  Filled 2023-02-21: qty 15

## 2023-02-21 NOTE — ED Provider Notes (Signed)
[facility] Provider Note    Event Date/Time   First MD Initiated Contact with Patient 02/21/23 1558     (approximate)   History   Abdominal pain   HPI  Gavon Christon is a 89 y.o. male who presents to the emergency department today because of concerns for abdominal pain.  The patient says that the pain started roughly 1 week ago.  Had initially been dull although has now gotten worse.  It is worse with deep breaths and movement.  It has not changed in location.  He denies any change in appetite, nausea vomiting or diarrhea.  Denies any change in urination.  No fevers.  No chills.    Physical Exam   Triage Vital Signs: ED Triage Vitals  Enc Vitals Group     BP 02/21/23 1550 (!) 144/87     Pulse Rate 02/21/23 1550 94     Resp 02/21/23 1550 16     Temp 02/21/23 1550 98 F (36.7 C)     Temp Source 02/21/23 1550 Oral     SpO2 02/21/23 1550 95 %     Weight 02/21/23 1551 145 lb (65.8 kg)     Height 02/21/23 1551 5\' 7"  (1.702 m)     Head Circumference --      Peak Flow --      Pain Score 02/21/23 1551 5     Pain Loc --      Pain Edu? --      Excl. in English? --     Most recent vital signs: Vitals:   02/21/23 1550  BP: (!) 144/87  Pulse: 94  Resp: 16  Temp: 98 F (36.7 C)  SpO2: 95%   General: Awake, alert, oriented. CV:  Good peripheral perfusion. Regular rate and rhythm. Resp:  Normal effort. Lungs clear. Abd:  No distention. Minimal tenderness to palpation on the right side. No guarding. No rash.    ED Results / Procedures / Treatments   Labs (all labs ordered are listed, but only abnormal results are displayed) Labs Reviewed  COMPREHENSIVE METABOLIC PANEL - Abnormal; Notable for the following components:      Result Value   Sodium 134 (*)    Glucose, Bld 142 (*)    Calcium 8.2 (*)    Total Protein 8.9 (*)    All other components within normal limits  URINALYSIS, ROUTINE W REFLEX MICROSCOPIC - Abnormal; Notable for the following  components:   Color, Urine STRAW (*)    APPearance CLEAR (*)    All other components within normal limits  LIPASE, BLOOD  CBC     EKG  None   RADIOLOGY I independently interpreted and visualized the CT abd.pel. My interpretation: No free air. No free fluid. Radiology interpretation: IMPRESSION:  Enhancing soft tissue mass with accompanying cystic focus at  anterior upper pole of LEFT kidney measuring 3.2 x 2.4 x 3.1 cm  highly suspicious for a renal neoplasm unchanged from prior MR.   Multiple BILATERAL renal cysts, no follow-up imaging recommended.   BILATERAL nonobstructing renal calculi.   Large hepatic cyst 10.3 x 8.2 x 7.8 cm, increased.   Mild scattered colonic diverticulosis without evidence of  diverticulitis.   Aneurysmal dilatation of the common iliac arteries bilaterally.   Chronic interstitial lung disease changes at lung bases with  advanced bronchiectasis and BILATERAL pleural thickening.     PROCEDURES:  Critical Care performed: No   MEDICATIONS ORDERED IN ED: Medications - No data to  display   IMPRESSION / MDM / ASSESSMENT AND PLAN / ED COURSE  I reviewed the triage vital signs and the nursing notes.                              Differential diagnosis includes, but is not limited to, appendicitis, kidney stone, UTI, pre rash shingles  Patient's presentation is most consistent with acute presentation with potential threat to life or bodily function.   Patient presented to the emergency department today because of concerns for right-sided abdominal pain.  On exam patient does have some mild tenderness to that area.  Patient was afebrile here.  Blood work without any concerning leukocytosis however given location of the patient's pain did obtain CT scan to evaluate for possible appendicitis or kidney stone.  CT scan did not show any acute concerning etiology of the patient's pain.  Urine without findings concerning for infection.  Did give patient  GI cocktail and he states that it did help.  I do wonder if he is suffering from gastritis/duodenitis.  Discussed this with the patient.  He is already on an acid so will add sucralfate. Discussed importance of primary care follow up.     FINAL CLINICAL IMPRESSION(S) / ED DIAGNOSES   Final diagnoses:  Right sided abdominal pain     Note:  This document was prepared using Dragon voice recognition software and may include unintentional dictation errors.    Nance Pear, MD 02/21/23 2001

## 2023-02-21 NOTE — Discharge Instructions (Signed)
Please seek medical attention for any high fevers, chest pain, shortness of breath, change in behavior, persistent vomiting, bloody stool or any other new or concerning symptoms.  

## 2023-02-21 NOTE — ED Triage Notes (Signed)
Pt to ED from home for RLQ abdominal pain since 3/14. Pt advised it is very dull but has been intermittent since then and he is worried about an appendicitis. Denies N/V/D. Pt is CAOx4, ambulatory in triage and in no acute distress at this time.

## 2023-02-25 DIAGNOSIS — R1031 Right lower quadrant pain: Secondary | ICD-10-CM | POA: Diagnosis not present

## 2023-03-08 DIAGNOSIS — K7689 Other specified diseases of liver: Secondary | ICD-10-CM | POA: Insufficient documentation

## 2023-03-08 DIAGNOSIS — R1011 Right upper quadrant pain: Secondary | ICD-10-CM | POA: Diagnosis not present

## 2023-03-08 DIAGNOSIS — N2889 Other specified disorders of kidney and ureter: Secondary | ICD-10-CM | POA: Diagnosis not present

## 2023-04-16 DIAGNOSIS — R634 Abnormal weight loss: Secondary | ICD-10-CM | POA: Diagnosis not present

## 2023-04-16 DIAGNOSIS — J471 Bronchiectasis with (acute) exacerbation: Secondary | ICD-10-CM | POA: Diagnosis not present

## 2023-04-20 ENCOUNTER — Ambulatory Visit
Admission: RE | Admit: 2023-04-20 | Discharge: 2023-04-20 | Disposition: A | Discharge status: Home / Self Care | Payer: Medicare Other | Source: Ambulatory Visit | Attending: Urology | Admitting: Urology

## 2023-04-20 DIAGNOSIS — N2889 Other specified disorders of kidney and ureter: Secondary | ICD-10-CM

## 2023-04-20 MED ORDER — GADOBUTROL 1 MMOL/ML IV SOLN
6.0000 mL | Freq: Once | INTRAVENOUS | Status: AC | PRN
  Administered 2023-04-20: 6 mL via INTRAVENOUS

## 2023-04-26 NOTE — Progress Notes (Unsigned)
04/27/23 11:56 AM   Timmie Foerster 1930-05-22 [id number]  Referring provider:  Marisue Ivan, MD [phone] [facility] [address] [facility] [place],  [place] [address]  Urological history  BPH with retention  -S/p HoLEP with removal 100 g tissue on 10/13/2019 with Dr Richardo Hanks   2. Elevated PSA  - PSA 7.2 [age] years age. Pathology from prostate chips obtained during the HoLEP in November 2020 were negative for malignancy   3. Left RCC  - MRI (2020) - In the anterior aspect of the upper pole the left kidney there is a 2.6 x 2.2 x 3.1 cm lesion that is heterogeneous in signal intensity on T1 weighted images (T1 isointense to T1 hyperintense), isointense on T2 weighted images, with avid arterial phase enhancement and some delayed washout, compatible with a solid renal neoplasm, likely a renal cell carcinoma. This appears encapsulated within Gerota's fascia, and is well separated from the left renal vein, which is widely patent. -MRI (03/2022) - Exophytic mass at the upper pole left kidney which demonstrates hemorrhagic cystic component and solid enhancing component again seen now measuring 2.7 x 2.5 x 3.2 cm -MRI (09/2022) - Minimally increased size of the heterogeneously arterially enhancing upper pole left renal lesion with internal hemorrhage, now measuring 3.5 cm, suspicious for renal cell carcinoma. -MRI (03/2023) - Unchanged partially exophytic contrast enhancing mass with an internal cystic area of the anterior superior pole of the left kidney measuring 3.3 x 2.6 cm, consistent with a renal cell carcinoma. This has slowly enlarged over time on prior examinations dating back to 08/25/2019.   HPI:  Noar Alessandro is a 89 y.o.male who presents today for MRI results and 6 month follow up.   -MRI (03/2023) - Unchanged partially exophytic contrast enhancing mass with an internal cystic area of the anterior superior pole of the left kidney measuring 3.3 x 2.6 cm, consistent with a renal cell  carcinoma. This has slowly enlarged over time on prior examinations dating back to 08/25/2019.   I PSS 10/2  He continues to have nocturia x 3.  Patient denies any modifying or aggravating factors.  Patient denies any gross hematuria, dysuria or suprapubic/flank pain.  Patient denies any fevers, chills, nausea or vomiting.     IPSS     Row Name 04/27/23 1100         International Prostate Symptom Score   How often have you had the sensation of not emptying your bladder? Less than 1 in 5     How often have you had to urinate less than every two hours? Less than half the time     How often have you found you stopped and started again several times when you urinated? Not at All     How often have you found it difficult to postpone urination? About half the time     How often have you had a weak urinary stream? Less than 1 in 5 times     How often have you had to strain to start urination? Not at All     How many times did you typically get up at night to urinate? 3 Times     Total IPSS Score 10       Quality of Life due to urinary symptoms   If you were to spend the rest of your life with your urinary condition just the way it is now how would you feel about that? Mostly Satisfied  Score:  1-7 Mild 8-19 Moderate 20-35 Severe   PMH: Past Medical History:  Diagnosis Date   BPH (benign prostatic hyperplasia)    Hypertension    Overactive bladder    Prostate enlargement    UTI (urinary tract infection)     Surgical History: Past Surgical History:  Procedure Laterality Date   COLONOSCOPY WITH PROPOFOL N/A 11/30/2019   Procedure: COLONOSCOPY WITH PROPOFOL;  Surgeon: Toledo, Boykin Nearing, MD;  Location: [facility] ENDOSCOPY;  Service: Gastroenterology;  Laterality: N/A;   ESOPHAGOGASTRODUODENOSCOPY (EGD) WITH PROPOFOL N/A 11/30/2019   Procedure: ESOPHAGOGASTRODUODENOSCOPY (EGD) WITH PROPOFOL;  Surgeon: Toledo, Boykin Nearing, MD;  Location: [facility] ENDOSCOPY;  Service:  Gastroenterology;  Laterality: N/A;   HOLEP-LASER ENUCLEATION OF THE PROSTATE WITH MORCELLATION N/A 10/13/2019   Procedure: HOLEP-LASER ENUCLEATION OF THE PROSTATE WITH MORCELLATION;  Surgeon: Sondra Come, MD;  Location: [facility] ORS;  Service: Urology;  Laterality: N/A;   NO PAST SURGERIES     none      Home Medications:  Allergies as of 04/27/2023       Reactions   Oxybutynin         Medication List        Accurate as of Apr 27, 2023 11:56 AM. If you have any questions, ask your nurse or doctor.          STOP taking these medications    sucralfate 1 g tablet Commonly known as: Carafate        Allergies:  Allergies  Allergen Reactions   Oxybutynin     Family History: Family History  Problem Relation Age of Onset   Lung cancer Father    Heart disease Sister    Lung cancer Brother    Throat cancer Brother     Social History:  reports that he quit smoking about 67 years ago. His smoking use included cigarettes. He has never used smokeless tobacco. He reports that he does not currently use alcohol. He reports that he does not currently use drugs.   Physical Exam: BP 118/76   Pulse 94   Ht 5\' 7"  (1.702 m)   Wt 145 lb (65.8 kg)   BMI 22.71 kg/m   Constitutional:  Well nourished. Alert and oriented, No acute distress. HEENT: [place] AT, moist mucus membranes.  Trachea midline Cardiovascular: No clubbing, cyanosis, or edema. Respiratory: Normal respiratory effort, no increased work of breathing. Neurologic: Grossly intact, no focal deficits, moving all 4 extremities. Psychiatric: Normal mood and affect.   Laboratory Data: Serum creatinine (04/2022) 1.1 I have reviewed the labs.   Pertinent Imaging: Narrative & Impression CLINICAL DATA:  Follow-up renal lesion   EXAM: MRI ABDOMEN WITHOUT AND WITH CONTRAST   TECHNIQUE: Multiplanar multisequence MR imaging of the abdomen was performed both before and after the administration of intravenous contrast.    CONTRAST:  6mL GADAVIST GADOBUTROL 1 MMOL/ML IV SOLN   COMPARISON:  02/21/2023, 08/25/2019   FINDINGS: Lower chest: No acute abnormality.   Hepatobiliary: No solid liver abnormality is seen. Large cyst or biliary cystadenoma of the right lobe of the liver measuring 10.0 x 8.2 cm (series 13, image 19) no gallstones, gallbladder wall thickening, or biliary dilatation.   Pancreas: Unchanged tiny fluid signal cystic lesions of the pancreatic body measuring no greater than 0.3 cm (series 4, image 12, 13). No pancreatic ductal dilatation or surrounding inflammatory changes.   Spleen: Normal in size without significant abnormality.   Adrenals/Urinary Tract: Adrenal glands are unremarkable. Unchanged partially exophytic contrast enhancing mass with  an internal cystic area of the anterior superior pole of the left kidney measuring 3.3 x 2.6 cm (series 13, image 33). Numerous additional benign bilateral renal cortical cysts, for which no specific further follow-up or characterization is required. Numerous bilateral nonobstructive renal calculi. No hydronephrosis.   Stomach/Bowel: Stomach is within normal limits. No evidence of bowel wall thickening, distention, or inflammatory changes.   Vascular/Lymphatic: No significant vascular findings are present. No enlarged abdominal lymph nodes.   Other: No abdominal wall hernia or abnormality. No ascites.   Musculoskeletal: No acute or significant osseous findings.   IMPRESSION: 1. Unchanged partially exophytic contrast enhancing mass with an internal cystic area of the anterior superior pole of the left kidney measuring 3.3 x 2.6 cm, consistent with a renal cell carcinoma. This has slowly enlarged over time on prior examinations dating back to 08/25/2019. 2. No evidence of renal vein invasion, lymphadenopathy, or metastatic disease in the abdomen. 3. Unchanged large cyst or biliary cystadenoma of the right lobe of the liver measuring  10.0 x 8.2 cm. 4. Unchanged tiny fluid signal cystic lesions of the pancreatic body measuring no greater than 0.3 cm. 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 No specific further follow-up or characterization is required. 5. Numerous bilateral nonobstructive renal calculi, better assessed by CT. No hydronephrosis.     Electronically Signed   By: Jearld Lesch M.D.   On: 04/20/2023 14:39   I have independently reviewed the films.  See HPI.      Assessment & Plan:    RCC - Left renal mass (3.3 x 2.6 cm ) likely RCC seen on MRI (03/2023) - slow enlargement when compared to serial examinations since 2020 - reviewed that the risk of metastasis for renal masses less than 3-4 cm is small (up to approximately 5%) and considering his advanced age would recommend to continue with active surveillance or referral to IR for cryoablation of the lesion - he wold like to continue active surveillance  -at this point in his life, he would like to discontinue monitoring the lesion and only repeat the scan should he have gross heme or flank pain -this is reasonable considering his advanced age  -we discussed the possible course of a renal malignancy that goes untreated and he voices understanding and would like to discontinue the monitoring    2. Urinary retention  - S/P HoLEP on 10/13/2019 -voiding well   3. BPH with obstruction/lower urinary tract symptoms - Continue conservative management, avoiding bladder irritants and timed voiding's   Return in about 1 year (around 04/26/2024) for I  PSS .  Cloretta Ned  [facility] [address], [address] [place], [place] [address] [phone]

## 2023-04-27 ENCOUNTER — Encounter: Payer: Self-pay | Admitting: Urology

## 2023-04-27 ENCOUNTER — Ambulatory Visit (INDEPENDENT_AMBULATORY_CARE_PROVIDER_SITE_OTHER): Payer: Medicare Other | Admitting: Urology

## 2023-04-27 VITALS — BP 118/76 | HR 94 | Ht 67.0 in | Wt 145.0 lb

## 2023-04-27 DIAGNOSIS — N401 Enlarged prostate with lower urinary tract symptoms: Secondary | ICD-10-CM

## 2023-04-27 DIAGNOSIS — C642 Malignant neoplasm of left kidney, except renal pelvis: Secondary | ICD-10-CM | POA: Diagnosis not present

## 2023-04-27 DIAGNOSIS — R351 Nocturia: Secondary | ICD-10-CM | POA: Diagnosis not present

## 2023-04-27 DIAGNOSIS — N138 Other obstructive and reflux uropathy: Secondary | ICD-10-CM | POA: Diagnosis not present

## 2023-06-04 DIAGNOSIS — Z1389 Encounter for screening for other disorder: Secondary | ICD-10-CM | POA: Diagnosis not present

## 2023-06-04 DIAGNOSIS — D649 Anemia, unspecified: Secondary | ICD-10-CM | POA: Diagnosis not present

## 2023-06-04 DIAGNOSIS — N19 Unspecified kidney failure: Secondary | ICD-10-CM | POA: Diagnosis not present

## 2023-06-04 DIAGNOSIS — G309 Alzheimer's disease, unspecified: Secondary | ICD-10-CM | POA: Diagnosis not present

## 2023-06-04 DIAGNOSIS — Z0000 Encounter for general adult medical examination without abnormal findings: Secondary | ICD-10-CM | POA: Diagnosis not present

## 2023-06-21 DIAGNOSIS — K7689 Other specified diseases of liver: Secondary | ICD-10-CM | POA: Diagnosis not present

## 2023-06-21 DIAGNOSIS — K219 Gastro-esophageal reflux disease without esophagitis: Secondary | ICD-10-CM | POA: Diagnosis not present

## 2023-06-21 DIAGNOSIS — D490 Neoplasm of unspecified behavior of digestive system: Secondary | ICD-10-CM | POA: Diagnosis not present

## 2023-06-21 DIAGNOSIS — K449 Diaphragmatic hernia without obstruction or gangrene: Secondary | ICD-10-CM | POA: Diagnosis not present

## 2023-06-24 DIAGNOSIS — K219 Gastro-esophageal reflux disease without esophagitis: Secondary | ICD-10-CM | POA: Diagnosis not present

## 2023-06-24 DIAGNOSIS — H6123 Impacted cerumen, bilateral: Secondary | ICD-10-CM | POA: Diagnosis not present

## 2023-12-02 DIAGNOSIS — N1831 Chronic kidney disease, stage 3a: Secondary | ICD-10-CM | POA: Diagnosis not present

## 2023-12-09 DIAGNOSIS — N1831 Chronic kidney disease, stage 3a: Secondary | ICD-10-CM | POA: Diagnosis not present

## 2023-12-09 DIAGNOSIS — K219 Gastro-esophageal reflux disease without esophagitis: Secondary | ICD-10-CM | POA: Diagnosis not present

## 2023-12-09 DIAGNOSIS — I129 Hypertensive chronic kidney disease with stage 1 through stage 4 chronic kidney disease, or unspecified chronic kidney disease: Secondary | ICD-10-CM | POA: Diagnosis not present

## 2023-12-09 DIAGNOSIS — Z862 Personal history of diseases of the blood and blood-forming organs and certain disorders involving the immune mechanism: Secondary | ICD-10-CM | POA: Diagnosis not present

## 2024-04-26 ENCOUNTER — Encounter: Payer: Self-pay | Admitting: Urology

## 2024-04-26 ENCOUNTER — Ambulatory Visit (INDEPENDENT_AMBULATORY_CARE_PROVIDER_SITE_OTHER): Payer: Medicare Other | Admitting: Urology

## 2024-04-26 VITALS — BP 147/83 | HR 80 | Ht 67.0 in | Wt 155.0 lb

## 2024-04-26 DIAGNOSIS — N138 Other obstructive and reflux uropathy: Secondary | ICD-10-CM

## 2024-04-26 DIAGNOSIS — N529 Male erectile dysfunction, unspecified: Secondary | ICD-10-CM

## 2024-04-26 DIAGNOSIS — C642 Malignant neoplasm of left kidney, except renal pelvis: Secondary | ICD-10-CM

## 2024-04-26 DIAGNOSIS — N401 Enlarged prostate with lower urinary tract symptoms: Secondary | ICD-10-CM | POA: Diagnosis not present

## 2024-04-26 LAB — BLADDER SCAN AMB NON-IMAGING

## 2024-04-26 MED ORDER — TADALAFIL 5 MG PO TABS: 5.0000 mg | ORAL_TABLET | Freq: Every day | ORAL | 3 refills | Status: AC | PRN

## 2024-04-26 NOTE — Progress Notes (Signed)
 04/26/24 10:50 AM   [name] 02-Jan-1930 [id number]  Referring provider:  Monique Ano, MD [phone] [facility] [address] [facility] [place],  [place] [address]  Urological history  BPH with retention  -S/p HoLEP with removal 100 g tissue on 10/13/2019 with Dr Estanislao Heimlich   2. Elevated PSA  - PSA 7.2 [age] years age. Pathology from prostate chips obtained during the HoLEP in November 2020 were negative for malignancy   3. Left RCC  - MRI (2020) - In the anterior aspect of the upper pole the left kidney there is a 2.6 x 2.2 x 3.1 cm lesion that is heterogeneous in signal intensity on T1 weighted images (T1 isointense to T1 hyperintense), isointense on T2 weighted images, with avid arterial phase enhancement and some delayed washout, compatible with a solid renal neoplasm, likely a renal cell carcinoma. This appears encapsulated within Gerota's fascia, and is well separated from the left renal vein, which is widely patent. -MRI (03/2022) - Exophytic mass at the upper pole left kidney which demonstrates hemorrhagic cystic component and solid enhancing component again seen now measuring 2.7 x 2.5 x 3.2 cm -MRI (09/2022) - Minimally increased size of the heterogeneously arterially enhancing upper pole left renal lesion with internal hemorrhage, now measuring 3.5 cm, suspicious for renal cell carcinoma. -MRI (03/2023) - Unchanged partially exophytic contrast enhancing mass with an internal cystic area of the anterior superior pole of the left kidney measuring 3.3 x 2.6 cm, consistent with a renal cell carcinoma. This has slowly enlarged over time on prior examinations dating back to 08/25/2019.  HPI:  James Oconnor is a 89 y.o.male who presents today for 12 month follow up.   Previous records reviewed.     He is having issues with erectile dysfunction and would like to discuss treatment options.  He is having difficulty achieving erections.  I PSS 9/2  PVR 42 mL   He has no urinary  complaints.   Patient denies any modifying or aggravating factors.  Patient denies any recent UTI's, gross hematuria, dysuria or suprapubic/flank pain.  Patient denies any fevers, chills, nausea or vomiting.      IPSS     Row Name 04/26/24 1000         International Prostate Symptom Score   How often have you had the sensation of not emptying your bladder? Less than 1 in 5     How often have you had to urinate less than every two hours? Not at All     How often have you found you stopped and started again several times when you urinated? Less than half the time     How often have you found it difficult to postpone urination? Less than 1 in 5 times     How often have you had a weak urinary stream? Less than 1 in 5 times     How often have you had to strain to start urination? Less than 1 in 5 times     How many times did you typically get up at night to urinate? 3 Times     Total IPSS Score 9       Quality of Life due to urinary symptoms   If you were to spend the rest of your life with your urinary condition just the way it is now how would you feel about that? Mostly Satisfied                Score:  1-7 Mild 8-19 Moderate  20-35 Severe   PMH: Past Medical History:  Diagnosis Date   BPH (benign prostatic hyperplasia)    Hypertension    Overactive bladder    Prostate enlargement    UTI (urinary tract infection)     Surgical History: Past Surgical History:  Procedure Laterality Date   COLONOSCOPY WITH PROPOFOL  N/A 11/30/2019   Procedure: COLONOSCOPY WITH PROPOFOL ;  Surgeon: Toledo, Alphonsus Jeans, MD;  Location: [facility] ENDOSCOPY;  Service: Gastroenterology;  Laterality: N/A;   ESOPHAGOGASTRODUODENOSCOPY (EGD) WITH PROPOFOL  N/A 11/30/2019   Procedure: ESOPHAGOGASTRODUODENOSCOPY (EGD) WITH PROPOFOL ;  Surgeon: Toledo, Alphonsus Jeans, MD;  Location: [facility];  Service: Gastroenterology;  Laterality: N/A;   HOLEP-LASER ENUCLEATION OF THE PROSTATE WITH MORCELLATION N/A 10/13/2019    Procedure: HOLEP-LASER ENUCLEATION OF THE PROSTATE WITH MORCELLATION;  Surgeon: Lawerence Pressman, MD;  Location: [facility] ORS;  Service: Urology;  Laterality: N/A;   NO PAST SURGERIES     none      Home Medications:  Allergies as of 04/26/2024       Reactions   Oxybutynin         Medication List        Accurate as of Apr 26, 2024 10:50 AM. If you have any questions, ask your nurse or doctor.          tadalafil  5 MG tablet Commonly known as: CIALIS  Take 1 tablet (5 mg total) by mouth daily as needed for erectile dysfunction. Started by: Matilde Son        Allergies:  Allergies  Allergen Reactions   Oxybutynin     Family History: Family History  Problem Relation Age of Onset   Lung cancer Father    Heart disease Sister    Lung cancer Brother    Throat cancer Brother     Social History:  reports that he quit smoking about 68 years ago. His smoking use included cigarettes. He has never used smokeless tobacco. He reports that he does not currently use alcohol. He reports that he does not currently use drugs.   Physical Exam: BP (!) 147/83   Pulse 80   Ht 5\' 7"  (1.702 m)   Wt 155 lb (70.3 kg)   BMI 24.28 kg/m   Constitutional:  Well nourished. Alert and oriented, No acute distress. HEENT: [place] AT, moist mucus membranes.  Trachea midline Cardiovascular: No clubbing, cyanosis, or edema. Respiratory: Normal respiratory effort, no increased work of breathing. Neurologic: Grossly intact, no focal deficits, moving all 4 extremities. Psychiatric: Normal mood and affect.   Laboratory Data: tains abnormal data CBC w/auto Differential (5 Part) Order: [id number] Component Ref Range & Units 4 mo ago  WBC (White Blood Cell Count) 4.1 - 10.2 10^3/uL 5.5  RBC (Red Blood Cell Count) 4.69 - 6.13 10^6/uL 4.72  Hemoglobin 14.1 - 18.1 gm/dL 40.9 Low   Hematocrit 81.1 - 52.0 % 40.1  MCV (Mean Corpuscular Volume) 80.0 - 100.0 fl 85  MCH (Mean Corpuscular  Hemoglobin) 27.0 - 31.2 pg 28.6  MCHC (Mean Corpuscular Hemoglobin Concentration) 32.0 - 36.0 gm/dL 91.4  Platelet Count 782 - 450 10^3/uL 247  RDW-CV (Red Cell Distribution Width) 11.6 - 14.8 % 14.1  MPV (Mean Platelet Volume) 9.4 - 12.4 fl 9.8  Neutrophils 1.50 - 7.80 10^3/uL 2.13  Lymphocytes 1.00 - 3.60 10^3/uL 2.62  Monocytes 0.00 - 1.50 10^3/uL 0.48  Eosinophils 0.00 - 0.55 10^3/uL 0.19  Basophils 0.00 - 0.09 10^3/uL 0.04  Neutrophil % 32.0 - 70.0 % 38.9  Lymphocyte % 10.0 - 50.0 %  47.9  Monocyte % 4.0 - 13.0 % 8.8  Eosinophil % 1.0 - 5.0 % 3.5  Basophil% 0.0 - 2.0 % 0.7  Immature Granulocyte % <=0.7 % 0.2  Immature Granulocyte Count <=0.06 10^3/L 0.01  Resulting Agency [facility]   Specimen Collected: 12/02/23 08:58   Performed by: [facility] Last Resulted: 12/02/23 09:33  Received From: [facility]  Result Received: 04/26/24 10:06    Basic Metabolic Panel (BMP) Order: [id number] Component Ref Range & Units 4 mo ago  Glucose 70 - 110 mg/dL 90  Sodium 478 - 295 mmol/L 142  Potassium 3.6 - 5.1 mmol/L 4.5  Chloride 97 - 109 mmol/L 106  Carbon Dioxide (CO2) 22.0 - 32.0 mmol/L 31.7  Calcium  8.7 - 10.3 mg/dL 9  Urea Nitrogen (BUN) 7 - 25 mg/dL 17  Creatinine 0.7 - 1.3 mg/dL 1.2  Glomerular Filtration Rate (eGFR) >60 mL/min/1.73sq m 57 Low   Comment: CKD-EPI (2021) does not include patient's race in the calculation of eGFR.  Monitoring changes of plasma creatinine and eGFR over time is useful for monitoring kidney function.  Interpretive Ranges for eGFR (CKD-EPI 2021):  eGFR:       >60 mL/min/1.73 sq. m - Normal eGFR:       30-59 mL/min/1.73 sq. m - Moderately Decreased eGFR:       15-29 mL/min/1.73 sq. m  - Severely Decreased eGFR:       < 15 mL/min/1.73 sq. m  - Kidney Failure   Note: These eGFR calculations do not apply in acute situations when eGFR is changing rapidly or patients on dialysis.   BUN/Crea Ratio 6.0 - 20.0 14.2  Anion Gap w/K 6.0 - 16.0 8.8  Resulting Agency [facility] - [facility]   Specimen Collected: 12/02/23 08:58   Performed by: [facility] Last Resulted: 12/02/23 13:24  Received From: [facility]  Result Received: 04/26/24 10:06  I have reviewed the labs.   Pertinent Imaging:  04/26/24 10:33  Scan Result 42ml    Assessment & Plan:    RCC - Has decided against active surveillance citing age, aware of the risks  2. Urinary retention  - S/P HoLEP on 10/13/2019 -voiding well  - Bladder scan demonstrates adequate emptying  3. BPH with obstruction/lower urinary tract symptoms - Continue conservative management, avoiding bladder irritants and timed voiding's - s/p HoLEP (2020)  4. ED - Since he is PDE5 inhibitor nave, we have decided to try PDE 5 inhibitor and he would like to go forward with 5 mg tadalafil 's daily - I have sent a prescription of the tadalafil  5 mg daily and he will notify me after 30 days of its effectiveness via MyChart - Specifically warned him against any nitroglycerin  use and PDE5i's concomitantly, he denies any nitroglycerin  use   Return in about 1 year (around 04/26/2025) for I PSS, SHIM, PVR .  Briant Camper  [facility] [address], [address] [place], [place] [address] [phone]

## 2024-06-26 NOTE — Progress Notes (Unsigned)
 Cardiology Office Note    Date:  06/27/2024   ID:  James Oconnor, DOB 1930/09/05, MRN [id number]  PCP:  Alla Amis, MD  Cardiologist:  Evalene Lunger, MD  Electrophysiologist:  None   Chief Complaint: Follow-up  History of Present Illness:   James Oconnor is a 89 y.o. 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  He was admitted in late 08/2019 with bronchitis. He indicated some mild left-sided chest discomfort. High sensitivity troponin of 105 with a delta of 80. CXR showed mild cardiomegaly with trace bilateral pleural effusions, atelectasis, and aortic atherosclerosis. Echo showed normal LVSF with an EF of 60-65%, mild LVH, DD, normal RVSF and cavity size, mild MR, and aortic valve sclerosis without evidence of stenosis. CTA chest was negative for PE with minimal coronary artery calcification, mild aortic atherosclerosis, and mild prominence of aortic arch measuring 4 cm. Incidentally, a left renal mass was noted with follow up MRI highly concerning for RCC.  He is followed by urology for [facility] with surveillance recommended.  Most recent chest CT from 2021 showed unchanged enlargement of the aortic arch measuring 4.1 x 4.1 cm.  He was last seen in our office in 07/2022 for aortic arch dilatation and hypertension and was without symptoms of angina or cardiac decompensation.  No further testing or intervention was pursued at that time.  He was felt to not be a surgical candidate for aortic arch repair should there be progression, and he reported he would not want to surgery.  He comes in doing well from a cardiac perspective and remains without symptoms of angina or cardiac decompensation.  No dizziness, presyncope, or syncope with no palpitations.  No falls or symptoms  concerning for bleeding.  He reports underlying acid reflux that is well-controlled with pantoprazole.  He again indicates he would not want surgery for aortic arch dilatation.  He does not have any acute cardiac concerns at this time.   Labs independently reviewed: 05/2024 - potassium 4.1, BUN 18, serum creatinine 1.3, albumin 4.1, AST/ALT normal, TC 159, TG 71, HDL 52, LDL 93, Hgb 13.0, PLT 274 12/7976 - TSH normal  Past Medical History:  Diagnosis Date   BPH (benign prostatic hyperplasia)    Hypertension    Overactive bladder    Prostate enlargement    UTI (urinary tract infection)     Past Surgical History:  Procedure Laterality Date   COLONOSCOPY WITH PROPOFOL  N/A 11/30/2019   Procedure: COLONOSCOPY WITH PROPOFOL ;  Surgeon: Toledo, Ladell POUR, MD;  Location: [facility] ENDOSCOPY;  Service: Gastroenterology;  Laterality: N/A;   ESOPHAGOGASTRODUODENOSCOPY (EGD) WITH PROPOFOL  N/A 11/30/2019   Procedure: ESOPHAGOGASTRODUODENOSCOPY (EGD) WITH PROPOFOL ;  Surgeon: Toledo, Ladell POUR, MD;  Location: [facility] ENDOSCOPY;  Service: Gastroenterology;  Laterality: N/A;   HOLEP-LASER ENUCLEATION OF THE PROSTATE WITH MORCELLATION N/A 10/13/2019   Procedure: HOLEP-LASER ENUCLEATION OF THE PROSTATE WITH MORCELLATION;  Surgeon: Francisca Redell BROCKS, MD;  Location: [facility] ORS;  Service: Urology;  Laterality: N/A;   NO PAST SURGERIES     none      Current Medications: Current Meds  Medication Sig   amLODipine (NORVASC) 5 MG tablet Take 5 mg by mouth daily.   pantoprazole (PROTONIX) 20 MG tablet Take 20 mg by mouth daily.    Allergies:   Oxybutynin   Social History   Socioeconomic  History   Marital status: Married    Spouse name: Not on file   Number of children: 4   Years of education: Not on file   Highest education level: Not on file  Occupational History   Not on file  Tobacco Use   Smoking status: Former    Current packs/day: 0.00    Types: Cigarettes    Start date: 73    Quit date: 4     Years since quitting: 68.6   Smokeless tobacco: Never   Tobacco comments:    quit smoking 48yrs ago  Vaping Use   Vaping status: Never Used  Substance and Sexual Activity   Alcohol use: Not Currently   Drug use: Not Currently   Sexual activity: Not on file  Other Topics Concern   Not on file  Social History Narrative   Not on file   Social Drivers of Health   Financial Resource Strain: Low Risk  (06/15/2024)   Received from [facility]   Overall Financial Resource Strain (CARDIA)    Difficulty of Paying Living Expenses: Not hard at all  Food Insecurity: No Food Insecurity (06/15/2024)   Received from [facility]   Hunger Vital Sign    Within the past 12 months, you worried that your food would run out before you got the money to buy more.: Never true    Within the past 12 months, the food you bought just didn't last and you didn't have money to get more.: Never true  Transportation Needs: No Transportation Needs (06/15/2024)   Received from [facility] - Transportation    In the past 12 months, has lack of transportation kept you from medical appointments or from getting medications?: No    Lack of Transportation (Non-Medical): No  Physical Activity: Not on file  Stress: Not on file  Social Connections: Not on file     Family History:  The patient's family history includes Heart disease in his sister; Lung cancer in his brother and father; Throat cancer in his brother.  ROS:   12-point review of systems is negative unless otherwise noted in the HPI.   EKGs/Labs/Other Studies Reviewed:    Studies reviewed were summarized above. The additional studies were reviewed today:  2D echo 08/25/2019: 1. Left ventricular ejection fraction, by visual estimation, is 60 to 65%. The left ventricle has normal function. Normal left ventricular size. There is mildly increased left ventricular hypertrophy.  2. Left ventricular  diastolic Doppler parameters are consistent with impaired relaxation pattern of LV diastolic filling.  3. Global right ventricle has normal systolic function.The right ventricular size is normal. No increase in right ventricular wall thickness.  4. Left atrial size was normal.  5. TR signal is inadequate for assessing pulmonary artery systolic pressure.  6. The inferior vena cava is normal in size with greater than 50% respiratory variability, suggesting right atrial pressure of 3 mmHg.   EKG:  EKG is ordered today.  The EKG ordered today demonstrates NSR, 74 bpm, first-degree AV block, LVH, no acute ST-T changes, consistent with prior tracing  Recent Labs: No results found for requested labs within last 365 days.  Recent Lipid Panel    Component Value Date/Time   CHOL 144 08/25/2019 0641   TRIG 75 08/25/2019 0641   HDL 55 08/25/2019 0641   CHOLHDL 2.6 08/25/2019 0641   VLDL 15 08/25/2019 0641   LDLCALC 74 08/25/2019 0641    PHYSICAL  EXAM:    VS:  BP 120/70 (BP Location: Left Arm, Patient Position: Sitting, Cuff Size: Normal)   Pulse 74   Ht 5' 7 (1.702 m)   Wt 156 lb (70.8 kg)   SpO2 98%   BMI 24.43 kg/m   BMI: Body mass index is 24.43 kg/m.  Physical Exam Vitals reviewed.  Constitutional:      Appearance: He is well-developed.  HENT:     Head: Normocephalic and atraumatic.  Eyes:     General:        Right eye: No discharge.        Left eye: No discharge.  Neck:     Vascular: No JVD.  Cardiovascular:     Rate and Rhythm: Normal rate and regular rhythm.     Pulses:          Posterior tibial pulses are 2+ on the right side and 2+ on the left side.     Heart sounds: S1 normal and S2 normal. Heart sounds not distant. No midsystolic click and no opening snap. Murmur heard.     Systolic murmur is present with a grade of 1/6 at the upper right sternal border.     Systolic murmur of grade 1/6 is also present at the lower left sternal border.     No friction rub.   Pulmonary:     Effort: Pulmonary effort is normal. No respiratory distress.     Breath sounds: Normal breath sounds. No decreased breath sounds, wheezing, rhonchi or rales.     Comments: Coarse breath sounds bilaterally. Chest:     Chest wall: No tenderness.  Musculoskeletal:     Cervical back: Normal range of motion.     Right lower leg: No edema.     Left lower leg: No edema.  Skin:    General: Skin is warm and dry.     Nails: There is no clubbing.  Neurological:     Mental Status: He is alert and oriented to person, place, and time.  Psychiatric:        Speech: Speech normal.        Behavior: Behavior normal.        Thought Content: Thought content normal.        Judgment: Judgment normal.     Wt Readings from Last 3 Encounters:  06/27/24 156 lb (70.8 kg)  04/26/24 155 lb (70.3 kg)  04/27/23 145 lb (65.8 kg)     ASSESSMENT & PLAN:   Coronary artery calcification/aortic atherosclerosis/HLD: He is without symptoms of angina or cardiac decompensation.  If not contraindicated would consider addition of aspirin  81 mg, this was deferred at this time with underlying reflux.  Dilated ascending thoracic aorta: Stable on imaging from 2021.  We discussed follow-up imaging, though patient prefers to avoid this at this time indicating he would not want to move forward with surgery even if there was progression of dilatation.    Cardiac murmur: Echo from 2020 showed mild mitral regurgitation and aortic valve sclerosis without evidence of stenosis.  Update echo.  HTN: Blood pressure is well-controlled in the office today.  He remains on amlodipine 5 mg.    Disposition: F/u with Dr. Gollan or an APP in 6 months.   Medication Adjustments/Labs and Tests Ordered: Current medicines are reviewed at length with the patient today.  Concerns regarding medicines are outlined above. Medication changes, Labs and Tests ordered today are summarized above and listed in the Patient Instructions  accessible in Encounters.  Signed, Bernardino Bring, PA-C 06/27/2024 2:42 PM     [facility] [place] [address] [place], [place] [address] [phone]

## 2024-06-27 ENCOUNTER — Encounter: Payer: Self-pay | Admitting: Physician Assistant

## 2024-06-27 ENCOUNTER — Ambulatory Visit: Attending: Physician Assistant | Admitting: Physician Assistant

## 2024-06-27 VITALS — BP 120/70 | HR 74 | Ht 67.0 in | Wt 156.0 lb

## 2024-06-27 DIAGNOSIS — I10 Essential (primary) hypertension: Secondary | ICD-10-CM

## 2024-06-27 DIAGNOSIS — I700 Atherosclerosis of aorta: Secondary | ICD-10-CM

## 2024-06-27 DIAGNOSIS — R011 Cardiac murmur, unspecified: Secondary | ICD-10-CM

## 2024-06-27 DIAGNOSIS — I2510 Atherosclerotic heart disease of native coronary artery without angina pectoris: Secondary | ICD-10-CM

## 2024-06-27 DIAGNOSIS — I77810 Thoracic aortic ectasia: Secondary | ICD-10-CM | POA: Diagnosis not present

## 2024-06-27 DIAGNOSIS — E782 Mixed hyperlipidemia: Secondary | ICD-10-CM

## 2024-06-27 NOTE — Patient Instructions (Addendum)
 Medication Instructions:   Your physician recommends that you continue on your current medications as directed. Please refer to the Current Medication list given to you today.   *If you need a refill on your cardiac medications before your next appointment, please call your pharmacy*  Lab Work:  No labs ordered today   If you have labs (blood work) drawn today and your tests are completely normal, you will receive your results only by: MyChart Message (if you have MyChart) OR A paper copy in the mail If you have any lab test that is abnormal or we need to change your treatment, we will call you to review the results.  Testing/Procedures:  Your physician has requested that you have an echocardiogram. Echocardiography is a painless test that uses sound waves to create images of your heart. It provides your doctor with information about the size and shape of your heart and how well your heart's chambers and valves are working.   You may receive an ultrasound enhancing agent through an IV if needed to better visualize your heart during the echo. This procedure takes approximately one hour.  There are no restrictions for this procedure.  This will take place at [address] [facility] [address], [place] [address]  Please note: We ask at that you not bring children with you during ultrasound (echo/ vascular) testing. Due to room size and safety concerns, children are not allowed in the ultrasound rooms during exams. Our front office staff cannot provide observation of children in our lobby area while testing is being conducted. An adult accompanying a patient to their appointment will only be allowed in the ultrasound room at the discretion of the ultrasound technician under special circumstances. We apologize for any inconvenience.   Follow-Up: At [facility], you and your health needs are our priority.  As part of our continuing mission to provide you with exceptional  heart care, our providers are all part of one team.  This team includes your primary Cardiologist (physician) and Advanced Practice Providers or APPs (Physician Assistants and Nurse Practitioners) who all work together to provide you with the care you need, when you need it.  Your next appointment:   6 month(s)  Provider:   You may see Timothy Gollan, MD or one of the following Advanced Practice Providers on your designated Care Team:    Bernardino Bring, PA-C

## 2024-07-07 DIAGNOSIS — H903 Sensorineural hearing loss, bilateral: Secondary | ICD-10-CM | POA: Diagnosis not present

## 2024-08-03 DIAGNOSIS — H903 Sensorineural hearing loss, bilateral: Secondary | ICD-10-CM | POA: Diagnosis not present

## 2024-08-14 ENCOUNTER — Other Ambulatory Visit

## 2024-12-28 ENCOUNTER — Ambulatory Visit: Admitting: Physician Assistant

## 2025-04-26 ENCOUNTER — Ambulatory Visit: Admitting: Urology
